# Patient Record
Sex: Female | Born: 1975 | Race: Black or African American | Hispanic: No | Marital: Single | Smoking: Former smoker
Health system: Southern US, Community
[De-identification: ages and names within clinical notes are randomized; demographics above are authoritative.]

## PROBLEM LIST (undated history)

## (undated) ENCOUNTER — Inpatient Hospital Stay (HOSPITAL_COMMUNITY): Payer: Self-pay

## (undated) DIAGNOSIS — I10 Essential (primary) hypertension: Secondary | ICD-10-CM

## (undated) DIAGNOSIS — E669 Obesity, unspecified: Secondary | ICD-10-CM

## (undated) DIAGNOSIS — E785 Hyperlipidemia, unspecified: Secondary | ICD-10-CM

## (undated) DIAGNOSIS — E66811 Obesity, class 1: Secondary | ICD-10-CM

## (undated) HISTORY — DX: Hyperlipidemia, unspecified: E78.5

## (undated) HISTORY — DX: Obesity, unspecified: E66.9

## (undated) HISTORY — PX: TONSILLECTOMY: SUR1361

## (undated) HISTORY — DX: Obesity, class 1: E66.811

---

## 2003-07-25 ENCOUNTER — Emergency Department (HOSPITAL_COMMUNITY): Admission: EM | Admit: 2003-07-25 | Discharge: 2003-07-25 | Payer: Self-pay | Admitting: Emergency Medicine

## 2009-09-19 ENCOUNTER — Encounter: Admission: RE | Admit: 2009-09-19 | Discharge: 2009-09-19 | Payer: Self-pay | Admitting: Family Medicine

## 2009-10-28 ENCOUNTER — Ambulatory Visit (HOSPITAL_BASED_OUTPATIENT_CLINIC_OR_DEPARTMENT_OTHER): Admission: RE | Admit: 2009-10-28 | Discharge: 2009-10-28 | Payer: Self-pay | Admitting: Otolaryngology

## 2010-08-26 ENCOUNTER — Emergency Department (HOSPITAL_COMMUNITY)
Admission: EM | Admit: 2010-08-26 | Discharge: 2010-08-26 | Disposition: A | Discharge status: Home / Self Care | Payer: Medicaid Other | Attending: Emergency Medicine | Admitting: Emergency Medicine

## 2010-08-26 ENCOUNTER — Emergency Department (HOSPITAL_COMMUNITY): Payer: Medicaid Other

## 2010-08-26 DIAGNOSIS — R112 Nausea with vomiting, unspecified: Secondary | ICD-10-CM | POA: Insufficient documentation

## 2010-08-26 DIAGNOSIS — R10816 Epigastric abdominal tenderness: Secondary | ICD-10-CM | POA: Insufficient documentation

## 2010-08-26 DIAGNOSIS — R109 Unspecified abdominal pain: Secondary | ICD-10-CM | POA: Insufficient documentation

## 2010-08-26 LAB — URINALYSIS, ROUTINE W REFLEX MICROSCOPIC
Bilirubin Urine: NEGATIVE
Hgb urine dipstick: NEGATIVE
Protein, ur: NEGATIVE mg/dL
Urobilinogen, UA: 1 mg/dL (ref 0.0–1.0)

## 2010-08-26 LAB — DIFFERENTIAL
Basophils Absolute: 0 10*3/uL (ref 0.0–0.1)
Eosinophils Absolute: 0 10*3/uL (ref 0.0–0.7)
Eosinophils Relative: 0 % (ref 0–5)
Lymphocytes Relative: 15 % (ref 12–46)
Monocytes Absolute: 0.9 10*3/uL (ref 0.1–1.0)

## 2010-08-26 LAB — COMPREHENSIVE METABOLIC PANEL
BUN: 9 mg/dL (ref 6–23)
Calcium: 9.3 mg/dL (ref 8.4–10.5)
GFR calc Af Amer: >60 mL/min (ref >60)
Glucose, Bld: 90 mg/dL (ref 70–99)
Sodium: 136 mEq/L (ref 135–145)
Total Protein: 7.5 g/dL (ref 6.0–8.3)

## 2010-08-26 LAB — CBC
HCT: 41 % (ref 36.0–46.0)
MCHC: 34.1 g/dL (ref 30.0–36.0)
RDW: 13.9 % (ref 11.5–15.5)

## 2010-08-26 LAB — LIPASE, BLOOD: Lipase: 17 U/L (ref 11–59)

## 2011-07-13 ENCOUNTER — Other Ambulatory Visit: Payer: Self-pay | Admitting: Family Medicine

## 2011-07-13 DIAGNOSIS — N63 Unspecified lump in unspecified breast: Secondary | ICD-10-CM

## 2011-07-16 ENCOUNTER — Ambulatory Visit
Admission: RE | Admit: 2011-07-16 | Discharge: 2011-07-16 | Disposition: A | Discharge status: Home / Self Care | Payer: Medicaid Other | Source: Ambulatory Visit | Attending: Family Medicine | Admitting: Family Medicine

## 2011-07-16 DIAGNOSIS — N63 Unspecified lump in unspecified breast: Secondary | ICD-10-CM

## 2011-10-13 ENCOUNTER — Encounter (HOSPITAL_COMMUNITY): Payer: Self-pay | Admitting: *Deleted

## 2011-10-13 ENCOUNTER — Emergency Department (HOSPITAL_COMMUNITY)
Admission: EM | Admit: 2011-10-13 | Discharge: 2011-10-14 | Disposition: A | Discharge status: Home / Self Care | Payer: Medicaid Other | Attending: Emergency Medicine | Admitting: Emergency Medicine

## 2011-10-13 DIAGNOSIS — F17200 Nicotine dependence, unspecified, uncomplicated: Secondary | ICD-10-CM | POA: Insufficient documentation

## 2011-10-13 DIAGNOSIS — L42 Pityriasis rosea: Secondary | ICD-10-CM

## 2011-10-13 DIAGNOSIS — I10 Essential (primary) hypertension: Secondary | ICD-10-CM

## 2011-10-13 NOTE — ED Notes (Signed)
Pt noticed a macular rash on her R arm, bottom and abd that are mildly pruritic.

## 2011-10-14 MED ORDER — FAMOTIDINE 20 MG PO TABS
40.0000 mg | ORAL_TABLET | Freq: Once | ORAL | Status: AC
  Administered 2011-10-14: 40 mg via ORAL
  Filled 2011-10-14: qty 2

## 2011-10-14 MED ORDER — FAMOTIDINE 40 MG PO TABS: 20.0000 mg | ORAL_TABLET | Freq: Two times a day (BID) | ORAL | Status: DC

## 2011-10-14 NOTE — ED Provider Notes (Signed)
History     CSN: [id number]  Arrival date & time 10/13/11  2134   First MD Initiated Contact with Patient 10/14/11 0114      Chief Complaint  Patient presents with  . Rash    (Consider location/radiation/quality/duration/timing/severity/associated sxs/prior treatment) HPI Comments: Rash started on inner R thigh 3 days ago now with other smaller lesions on upper abdomen, back upper arms   The history is provided by the patient.    History reviewed. No pertinent past medical history.  Past Surgical History  Procedure Date  . Tonsillectomy     No family history on file.  History  Substance Use Topics  . Smoking status: Current Everyday Smoker -- 5 years    Types: Cigarettes  . Smokeless tobacco: Not on file  . Alcohol Use: No    OB History    Grav Para Term Preterm Abortions TAB SAB Ect Mult Living                  Review of Systems  Constitutional: Negative for fever.  Skin: Positive for rash.  Neurological: Negative for weakness.    Allergies  Review of patient's allergies indicates no known allergies.  Home Medications   Current Outpatient Rx  Name Route Sig Dispense Refill  . BEE POLLEN PO Oral Take 1 capsule by mouth daily as needed. For allergies    . FAMOTIDINE 40 MG PO TABS Oral Take 0.5 tablets (20 mg total) by mouth 2 (two) times daily. 60 tablet 0    BP 183/110  Pulse 100  Temp 99.2 F (37.3 C) (Oral)  Resp 18  SpO2 100%  Physical Exam  Constitutional: She appears well-developed.  Eyes: Pupils are equal, round, and reactive to light.  Cardiovascular: Normal rate.   Pulmonary/Chest: Effort normal.  Musculoskeletal: Normal range of motion.  Neurological: She is alert.  Skin: Rash noted.    ED Course  Procedures (including critical care time)  Labs Reviewed - No data to display No results found.   1. Pityriasis rosea   2. Hypertension       MDM          Arman Filter, NP 10/14/11 2021  Arman Filter,  NP 10/14/11 2021

## 2011-10-15 NOTE — ED Provider Notes (Signed)
Medical screening examination/treatment/procedure(s) were performed by non-physician practitioner and as supervising physician I was immediately available for consultation/collaboration.  Flint Melter, MD 10/15/11 [phone]

## 2011-12-06 ENCOUNTER — Emergency Department (HOSPITAL_COMMUNITY): Admission: EM | Admit: 2011-12-06 | Discharge: 2011-12-06 | Payer: Self-pay | Source: Home / Self Care

## 2011-12-29 ENCOUNTER — Other Ambulatory Visit: Payer: Self-pay

## 2011-12-29 ENCOUNTER — Other Ambulatory Visit (HOSPITAL_COMMUNITY): Payer: Self-pay | Admitting: Family Medicine

## 2011-12-29 ENCOUNTER — Ambulatory Visit (HOSPITAL_COMMUNITY)
Admission: RE | Admit: 2011-12-29 | Discharge: 2011-12-29 | Disposition: A | Discharge status: Home / Self Care | Payer: Medicaid Other | Source: Ambulatory Visit | Attending: Family Medicine | Admitting: Family Medicine

## 2011-12-29 DIAGNOSIS — I10 Essential (primary) hypertension: Secondary | ICD-10-CM | POA: Insufficient documentation

## 2011-12-29 DIAGNOSIS — R079 Chest pain, unspecified: Secondary | ICD-10-CM | POA: Insufficient documentation

## 2011-12-29 DIAGNOSIS — R52 Pain, unspecified: Secondary | ICD-10-CM

## 2011-12-29 DIAGNOSIS — Z01812 Encounter for preprocedural laboratory examination: Secondary | ICD-10-CM | POA: Insufficient documentation

## 2011-12-29 LAB — CBC
Hemoglobin: 13.4 g/dL (ref 12.0–15.0)
RBC: 4.78 MIL/uL (ref 3.87–5.11)
WBC: 6.7 10*3/uL (ref 4.0–10.5)

## 2011-12-29 LAB — MICROALBUMIN, URINE: Microalb, Ur: 5.16 mg/dL — ABNORMAL HIGH (ref 0.00–1.89)

## 2011-12-29 LAB — COMPREHENSIVE METABOLIC PANEL
ALT: 15 U/L (ref 0–35)
Alkaline Phosphatase: 79 U/L (ref 39–117)
CO2: 28 mEq/L (ref 19–32)
GFR calc Af Amer: >90 mL/min (ref >90)
GFR calc non Af Amer: 88 mL/min — ABNORMAL LOW (ref >90)
Glucose, Bld: 107 mg/dL — ABNORMAL HIGH (ref 70–99)
Potassium: 4.3 mEq/L (ref 3.5–5.1)
Sodium: 139 mEq/L (ref 135–145)
Total Protein: 7.6 g/dL (ref 6.0–8.3)

## 2011-12-29 LAB — LIPID PANEL: Total CHOL/HDL Ratio: 5.3 RATIO

## 2011-12-29 LAB — TSH: TSH: 0.553 u[IU]/mL (ref 0.350–4.500)

## 2013-09-10 ENCOUNTER — Other Ambulatory Visit (HOSPITAL_COMMUNITY): Payer: Self-pay | Admitting: Internal Medicine

## 2013-09-10 ENCOUNTER — Ambulatory Visit (HOSPITAL_COMMUNITY)
Admission: RE | Admit: 2013-09-10 | Discharge: 2013-09-10 | Disposition: A | Discharge status: Home / Self Care | Payer: Medicaid Other | Source: Ambulatory Visit | Attending: Internal Medicine | Admitting: Internal Medicine

## 2013-09-10 DIAGNOSIS — M79641 Pain in right hand: Secondary | ICD-10-CM

## 2013-09-10 DIAGNOSIS — W19XXXA Unspecified fall, initial encounter: Secondary | ICD-10-CM

## 2013-09-10 DIAGNOSIS — M79609 Pain in unspecified limb: Secondary | ICD-10-CM | POA: Insufficient documentation

## 2013-09-10 DIAGNOSIS — Y92009 Unspecified place in unspecified non-institutional (private) residence as the place of occurrence of the external cause: Secondary | ICD-10-CM

## 2013-11-16 ENCOUNTER — Encounter (HOSPITAL_COMMUNITY): Payer: Self-pay | Admitting: *Deleted

## 2013-11-16 ENCOUNTER — Inpatient Hospital Stay (HOSPITAL_COMMUNITY): Payer: Medicaid Other

## 2013-11-16 ENCOUNTER — Inpatient Hospital Stay (HOSPITAL_COMMUNITY)
Admission: AD | Admit: 2013-11-16 | Discharge: 2013-11-16 | Disposition: A | Discharge status: Home / Self Care | Payer: Medicaid Other | Source: Ambulatory Visit | Attending: Obstetrics and Gynecology | Admitting: Obstetrics and Gynecology

## 2013-11-16 DIAGNOSIS — M545 Low back pain, unspecified: Secondary | ICD-10-CM | POA: Insufficient documentation

## 2013-11-16 DIAGNOSIS — O99330 Smoking (tobacco) complicating pregnancy, unspecified trimester: Secondary | ICD-10-CM | POA: Insufficient documentation

## 2013-11-16 DIAGNOSIS — O209 Hemorrhage in early pregnancy, unspecified: Secondary | ICD-10-CM | POA: Diagnosis not present

## 2013-11-16 DIAGNOSIS — O09529 Supervision of elderly multigravida, unspecified trimester: Secondary | ICD-10-CM | POA: Insufficient documentation

## 2013-11-16 HISTORY — DX: Essential (primary) hypertension: I10

## 2013-11-16 LAB — URINALYSIS, ROUTINE W REFLEX MICROSCOPIC
Bilirubin Urine: NEGATIVE
GLUCOSE, UA: NEGATIVE mg/dL
KETONES UR: NEGATIVE mg/dL
LEUKOCYTES UA: NEGATIVE
Nitrite: NEGATIVE
PH: 7 (ref 5.0–8.0)
Protein, ur: NEGATIVE mg/dL
Specific Gravity, Urine: 1.01 (ref 1.005–1.030)
Urobilinogen, UA: 1 mg/dL (ref 0.0–1.0)

## 2013-11-16 LAB — CBC
HEMATOCRIT: 38.4 % (ref 36.0–46.0)
HEMOGLOBIN: 12.9 g/dL (ref 12.0–15.0)
MCH: 28.9 pg (ref 26.0–34.0)
MCHC: 33.6 g/dL (ref 30.0–36.0)
MCV: 85.9 fL (ref 78.0–100.0)
Platelets: 378 10*3/uL (ref 150–400)
RBC: 4.47 MIL/uL (ref 3.87–5.11)
RDW: 15.5 % (ref 11.5–15.5)
WBC: 7.4 10*3/uL (ref 4.0–10.5)

## 2013-11-16 LAB — HIV ANTIBODY (ROUTINE TESTING W REFLEX): HIV: NONREACTIVE

## 2013-11-16 LAB — HCG, QUANTITATIVE, PREGNANCY: HCG, BETA CHAIN, QUANT, S: 17358 m[IU]/mL — AB (ref <5)

## 2013-11-16 LAB — URINE MICROSCOPIC-ADD ON

## 2013-11-16 LAB — WET PREP, GENITAL
TRICH WET PREP: NONE SEEN
YEAST WET PREP: NONE SEEN

## 2013-11-16 LAB — POCT PREGNANCY, URINE: PREG TEST UR: POSITIVE — AB

## 2013-11-16 LAB — ABO/RH: ABO/RH(D): B POS

## 2013-11-16 NOTE — Discharge Instructions (Signed)
Vaginal Bleeding During Pregnancy, First Trimester  A small amount of bleeding (spotting) from the vagina is relatively common in early pregnancy. It usually stops on its own. Various things may cause bleeding or spotting in early pregnancy. Some bleeding may be related to the pregnancy, and some may not. In most cases, the bleeding is normal and is not a problem. However, bleeding can also be a sign of something serious. Be sure to tell your health care provider about any vaginal bleeding right away.  Some possible causes of vaginal bleeding during the first trimester include:  · Infection or inflammation of the cervix.  · Growths (polyps) on the cervix.  · Miscarriage or threatened miscarriage.  · Pregnancy tissue has developed outside of the uterus and in a fallopian tube (tubal pregnancy).  · Tiny cysts have developed in the uterus instead of pregnancy tissue (molar pregnancy).  HOME CARE INSTRUCTIONS   Watch your condition for any changes. The following actions may help to lessen any discomfort you are feeling:  · Follow your health care provider's instructions for limiting your activity. If your health care provider orders bed rest, you may need to stay in bed and only get up to use the bathroom. However, your health care provider may allow you to continue light activity.  · If needed, make plans for someone to help with your regular activities and responsibilities while you are on bed rest.  · Keep track of the number of pads you use each day, how often you change pads, and how soaked (saturated) they are. Write this down.  · Do not use tampons. Do not douche.  · Do not have sexual intercourse or orgasms until approved by your health care provider.  · If you pass any tissue from your vagina, save the tissue so you can show it to your health care provider.  · Only take over-the-counter or prescription medicines as directed by your health care provider.  · Do not take aspirin because it can make you  bleed.  · Keep all follow-up appointments as directed by your health care provider.  SEEK MEDICAL CARE IF:  · You have any vaginal bleeding during any part of your pregnancy.  · You have cramps or labor pains.  · You have a fever, not controlled by medicine.  SEEK IMMEDIATE MEDICAL CARE IF:   · You have severe cramps in your back or belly (abdomen).  · You pass large clots or tissue from your vagina.  · Your bleeding increases.  · You feel light-headed or weak, or you have fainting episodes.  · You have chills.  · You are leaking fluid or have a gush of fluid from your vagina.  · You pass out while having a bowel movement.  MAKE SURE YOU:  · Understand these instructions.  · Will watch your condition.  · Will get help right away if you are not doing well or get worse.  Document Released: 12/02/2004 Document Revised: 02/27/2013 Document Reviewed: 10/30/2012  ExitCare® Patient Information ©2015 [employer]. This information is not intended to replace advice given to you by your health care provider. Make sure you discuss any questions you have with your health care provider.

## 2013-11-16 NOTE — MAU Note (Signed)
Pt presents to MAU with complaints of lower back pain for approximately 2 weeks. Pt reports taking a "Morning after pill" in July after having intercourse. Reports she has had a light period in August.

## 2013-11-16 NOTE — MAU Provider Note (Signed)
Chief Complaint: Back Pain   First Provider Initiated Contact with Patient 11/16/13 0914     SUBJECTIVE HPI: Jennifer Ayers is a 38 y.o. G3P1011 at [redacted]w[redacted]d by LNMP 09/17/13 who presents with LBP x 2 days and vaginal spotting episodes once in August and again 3 days ago. Denies vaginal irritation or age. Denies dysuria, urgency or frequency or hematuria. She has had some nausea without vomiting. She has not taken her Norvasc for 2 days due to concern with usage in pregnancy. Desired pregnancy though she took Morning-after pill in early July.  Past Medical History  Diagnosis Date  . Hypertension    OB History  Gravida Para Term Preterm AB SAB TAB Ectopic Multiple Living  0 1 1 0 0 0 1    # Outcome Date GA Lbr Len/2nd Weight Sex Delivery Anes PTL Lv  3 TRM 06/03/01    M LTCS EPI N Y  2 SAB           1 GRA             C/S in [place] for NRFHR in labor  Past Surgical History  Procedure Laterality Date  . Tonsillectomy    . Cesarean section     History   Social History  . Marital Status: Married    Spouse Name: N/A    Number of Children: N/A  . Years of Education: N/A   Occupational History  . Not on file.   Social History Main Topics  . Smoking status: Current Every Day Smoker -- 5 years    Types: Cigarettes  . Smokeless tobacco: Not on file  . Alcohol Use: No  . Drug Use: No  . Sexual Activity: Yes    Birth Control/ Protection: None   Other Topics Concern  . Not on file   Social History Narrative  . No narrative on file   No current facility-administered medications on file prior to encounter.   No current outpatient prescriptions on file prior to encounter.   No Known Allergies  ROS: Pertinent items in HPI  OBJECTIVE Blood pressure 149/90, pulse 77, temperature 98.6 F (37 C), resp. rate 18, last menstrual period 09/17/2013. GENERAL: Well-developed, well-nourished female in no acute distress.  HEENT: Normocephalic HEART: normal rate RESP: normal  effort ABDOMEN: Soft, non-tender EXTREMITIES: Nontender, no edema NEURO: Alert and oriented SPECULUM EXAM: NEFG, physiologic discharge, no blood noted, cervix clean BIMANUAL: cervix C/L; uterus normal size, no adnexal tenderness or masses  LAB RESULTS Results for orders placed during the hospital encounter of 11/16/13 (from the past 24 hour(s))  URINALYSIS, ROUTINE W REFLEX MICROSCOPIC     Status: Abnormal   Collection Time    11/16/13  8:30 AM      Result Value Ref Range   Color, Urine YELLOW  YELLOW   APPearance CLEAR  CLEAR   Specific Gravity, Urine 1.010  1.005 - 1.030   pH 7.0  5.0 - 8.0   Glucose, UA NEGATIVE  NEGATIVE mg/dL   Hgb urine dipstick TRACE (*) NEGATIVE   Bilirubin Urine NEGATIVE  NEGATIVE   Ketones, ur NEGATIVE  NEGATIVE mg/dL   Protein, ur NEGATIVE  NEGATIVE mg/dL   Urobilinogen, UA 1.0  0.0 - 1.0 mg/dL   Nitrite NEGATIVE  NEGATIVE   Leukocytes, UA NEGATIVE  NEGATIVE  URINE MICROSCOPIC-ADD ON     Status: Abnormal   Collection Time    11/16/13  8:30 AM      Result Value Ref Range  Squamous Epithelial / LPF FEW (*) RARE   WBC, UA 0-2  <3 WBC/hpf   Bacteria, UA FEW (*) RARE   Urine-Other MUCOUS PRESENT    POCT PREGNANCY, URINE     Status: Abnormal   Collection Time    11/16/13  8:58 AM      Result Value Ref Range   Preg Test, Ur POSITIVE (*) NEGATIVE  WET PREP, GENITAL     Status: Abnormal   Collection Time    11/16/13  9:30 AM      Result Value Ref Range   Yeast Wet Prep HPF POC NONE SEEN  NONE SEEN   Trich, Wet Prep NONE SEEN  NONE SEEN   Clue Cells Wet Prep HPF POC FEW (*) NONE SEEN   WBC, Wet Prep HPF POC FEW (*) NONE SEEN  ABO/RH     Status: None   Collection Time    11/16/13  9:50 AM      Result Value Ref Range   ABO/RH(D) B POS    CBC     Status: None   Collection Time    11/16/13  9:50 AM      Result Value Ref Range   WBC 7.4  4.0 - 10.5 K/uL   RBC 4.47  3.87 - 5.11 MIL/uL   Hemoglobin 12.9  12.0 - 15.0 g/dL   HCT 16.1  09.6 - 04.5 %    MCV 85.9  78.0 - 100.0 fL   MCH 28.9  26.0 - 34.0 pg   MCHC 33.6  30.0 - 36.0 g/dL   RDW 40.9  81.1 - 91.4 %   Platelets 378  150 - 400 K/uL    IMAGING  CLINICAL DATA: Vaginal spotting, pain for 2 weeks, took morning  after fill in July 2015  EXAM:  OBSTETRIC <14 WK [place] AND TRANSVAGINAL OB US  TECHNIQUE:  Both transabdominal and transvaginal ultrasound examinations were  performed for complete evaluation of the gestation as well as the  maternal uterus, adnexal regions, and pelvic cul-de-sac.  Transvaginal technique was performed to assess early pregnancy.  COMPARISON: None  FINDINGS:  Intrauterine gestational sac: Visualized, slightly irregularly  shaped  Yolk sac: Not visualized  Embryo: Not visualized  Cardiac Activity: N/A  Heart Rate: N/A bpm  MSD: 13 mm 6 w 1 d [place] EDC: 07/11/2014  Maternal uterus/adnexae:  Very small subchorionic hemorrhage.  No uterine mass.  LEFT ovary normal size and morphology, 2.2 x 2.8 x 2.6 cm.  RIGHT ovary normal size and morphology, 2.1 x 1.7 x 2.7 cm.  No free pelvic fluid or adnexal masses.  IMPRESSION:  Probable early intrauterine gestational sac, but no yolk sac, fetal  pole, or cardiac activity yet visualized. Recommend follow-up  quantitative B-HCG levels and follow-up US in 14 days to confirm and  assess viability. This recommendation follows SRU consensus  guidelines: Diagnostic Criteria for Nonviable Pregnancy Early in the  First Trimester. Malva Limes Med 2013; 782:9562-13.  Very small subchorionic hemorrhage.  Electronically Signed  By: Ulyses Southward M.D.  On: 11/16/2013 10:48         MAU COURSE Discussed with pt possible BV, asymptomatic> wishes to defer tx or recheck [facility] after 1st trimester Pt left before quantitative bHCG result back ( I will call her with results) Explained possibility of early pregnancy failure with empty sac and MSD c/w [redacted]w[redacted]d.  ASSESSMENT 1. Bleeding in early pregnancy   Pregnancy location and viability  unknown  PLAN Discharge home with ectopic  and bleeding precautions    Medication List    STOP taking these medications       amLODipine 10 MG tablet  Commonly known as:  NORVASC     aspirin 81 MG chewable tablet     lovastatin 10 MG tablet  Commonly known as:  MEVACOR      Call PMD re anti- HTN med adjustment after viable IUP confirmation  Follow-up Information   Follow up with Nursepractioner Mau, NP In 2 days. (repeat blood test)       Danae Orleans, CNM 11/16/2013  9:29 AM   Addendum:  Quantitative beta hCG was 17,358. Pt notified by phone message as planned. F/U tomorrow.  Danae Orleans, CNM 11/17/2013 12:45 PM

## 2013-11-17 LAB — GC/CHLAMYDIA PROBE AMP
CT Probe RNA: NEGATIVE
GC PROBE AMP APTIMA: NEGATIVE

## 2013-11-20 NOTE — MAU Provider Note (Signed)
Attestation of Attending Supervision of Advanced Practitioner (CNM/NP): Evaluation and management procedures were performed by the Advanced Practitioner under my supervision and collaboration.  I have reviewed the Advanced Practitioner's note and chart, and I agree with the management and plan.  Dorothyann Mourer 11/20/2013 9:33 AM   

## 2013-11-25 ENCOUNTER — Inpatient Hospital Stay (HOSPITAL_COMMUNITY)
Admission: AD | Admit: 2013-11-25 | Discharge: 2013-11-25 | Disposition: A | Discharge status: Home / Self Care | Payer: Medicaid Other | Source: Ambulatory Visit | Attending: Obstetrics | Admitting: Obstetrics

## 2013-11-25 DIAGNOSIS — O039 Complete or unspecified spontaneous abortion without complication: Secondary | ICD-10-CM | POA: Insufficient documentation

## 2013-11-25 LAB — HCG, QUANTITATIVE, PREGNANCY: HCG, BETA CHAIN, QUANT, S: 9695 m[IU]/mL — AB (ref <5)

## 2013-11-25 NOTE — Discharge Instructions (Signed)
Miscarriage °A miscarriage is the loss of an unborn baby (fetus) before the 20th week of pregnancy. The cause is often unknown.  °HOME CARE °· You may need to stay in bed (bed rest), or you may be able to do light activity. Go about activity as told by your doctor. °· Have help at home. °· Write down how many pads you use each day. Write down how soaked they are. °· Do not use tampons. Do not wash out your vagina (douche) or have sex (intercourse) until your doctor approves. °· Only take medicine as told by your doctor. °· Do not take aspirin. °· Keep all doctor visits as told. °· If you or your partner have problems with grieving, talk to your doctor. You can also try counseling. Give yourself time to grieve before trying to get pregnant again. °GET HELP RIGHT AWAY IF: °· You have bad cramps or pain in your back or belly (abdomen). °· You have a fever. °· You pass large clumps of blood (clots) from your vagina that are walnut-sized or larger. Save the clumps for your doctor to see. °· You pass large amounts of tissue from your vagina. Save the tissue for your doctor to see. °· You have more bleeding. °· You have thick, bad-smelling fluid (discharge) coming from the vagina. °· You get lightheaded, weak, or you pass out (faint). °· You have chills. °MAKE SURE YOU: °· Understand these instructions. °· Will watch your condition. °· Will get help right away if you are not doing well or get worse. °Document Released: 05/17/2011 Document Reviewed: 05/17/2011 °ExitCare® Patient Information ©2015 [employer]. This information is not intended to replace advice given to you by your health care provider. Make sure you discuss any questions you have with your health care provider. ° °

## 2013-11-25 NOTE — MAU Note (Signed)
Pt presents to MAU for repeat BHCG. Denies any pain or vaginal bleeding 

## 2013-11-25 NOTE — MAU Provider Note (Signed)
Jennifer Ayers is a 38 y.o. G4P1011 at [redacted]w[redacted]d here for follow up quant HCG. Vaginal bleeding: none. Abdominal pain: none. She previously passed "a few large clots", but has had no bleeding since.   Prior HCGs: Results for Jennifer, [facility] (MRN [id number]) as of 11/25/2013 09:51  Ref. Range 11/16/2013 09:50  hCG, Beta Chain, Quant, S Latest Range: <5 mIU/mL 17358 (H)   Prior U/S: US Ob Comp Less 14 Wks  11/16/2013   CLINICAL DATA:  Vaginal spotting, pain for 2 weeks, took morning after fill in July 2015  EXAM: OBSTETRIC <14 WK [place] AND TRANSVAGINAL OB US  TECHNIQUE: Both transabdominal and transvaginal ultrasound examinations were performed for complete evaluation of the gestation as well as the maternal uterus, adnexal regions, and pelvic cul-de-sac. Transvaginal technique was performed to assess early pregnancy.  COMPARISON:  None  FINDINGS: Intrauterine gestational sac: Visualized, slightly irregularly shaped  Yolk sac:  Not visualized  Embryo:  Not visualized  Cardiac Activity: N/A  Heart Rate:  N/A bpm  MSD:  13  mm   6 w   1  d               [place] EDC: 07/11/2014  Maternal uterus/adnexae:  Very small subchorionic hemorrhage.  No uterine mass.  LEFT ovary normal size and morphology, 2.2 x 2.8 x 2.6 cm.  RIGHT ovary normal size and morphology, 2.1 x 1.7 x 2.7 cm.  No free pelvic fluid or adnexal masses.  IMPRESSION: Probable early intrauterine gestational sac, but no yolk sac, fetal pole, or cardiac activity yet visualized. Recommend follow-up quantitative B-HCG levels and follow-up US in 14 days to confirm and assess viability. This recommendation follows SRU consensus guidelines: Diagnostic Criteria for Nonviable Pregnancy Early in the First Trimester. Malva Limes Med 2013; 956:3875-64.  Very small subchorionic hemorrhage.   Electronically Signed   By: Ulyses Southward M.D.   On: 11/16/2013 10:48   US Ob Transvaginal  11/16/2013   CLINICAL DATA:  Vaginal spotting, pain for 2 weeks, took morning after fill in July  2015  EXAM: OBSTETRIC <14 WK [place] AND TRANSVAGINAL OB US  TECHNIQUE: Both transabdominal and transvaginal ultrasound examinations were performed for complete evaluation of the gestation as well as the maternal uterus, adnexal regions, and pelvic cul-de-sac. Transvaginal technique was performed to assess early pregnancy.  COMPARISON:  None  FINDINGS: Intrauterine gestational sac: Visualized, slightly irregularly shaped  Yolk sac:  Not visualized  Embryo:  Not visualized  Cardiac Activity: N/A  Heart Rate:  N/A bpm  MSD:  13  mm   6 w   1  d               [place] EDC: 07/11/2014  Maternal uterus/adnexae:  Very small subchorionic hemorrhage.  No uterine mass.  LEFT ovary normal size and morphology, 2.2 x 2.8 x 2.6 cm.  RIGHT ovary normal size and morphology, 2.1 x 1.7 x 2.7 cm.  No free pelvic fluid or adnexal masses.  IMPRESSION: Probable early intrauterine gestational sac, but no yolk sac, fetal pole, or cardiac activity yet visualized. Recommend follow-up quantitative B-HCG levels and follow-up US in 14 days to confirm and assess viability. This recommendation follows SRU consensus guidelines: Diagnostic Criteria for Nonviable Pregnancy Early in the First Trimester. Malva Limes Med 2013; 332:9518-84.  Very small subchorionic hemorrhage.   Electronically Signed   By: Ulyses Southward M.D.   On: 11/16/2013 10:48    Past Medical History  Diagnosis Date  .  Hypertension     No prescriptions prior to admission    No Known Allergies  Review of Systems: negative  Objective BP 133/83  Pulse 73  Temp(Src) 98.7 F (37.1 C)  Resp 18  LMP 09/17/2013  General: Alert, oriented, no acute distress  Results for orders placed during the hospital encounter of 11/25/13 (from the past 24 hour(s))  HCG, QUANTITATIVE, PREGNANCY     Status: Abnormal   Collection Time    11/25/13  9:05 AM      Result Value Ref Range   hCG, Beta Chain, Quant, S 9695 (*) <5 mIU/mL    Assessment  1. SAB (spontaneous abortion)   Significant  decrease in BHCG c/w SAB, precautions rev'd, return to MAU w/ pain or bleeding, f/u in clinic in 1 week for repeat BHCG     Medication List    Notice   You have not been prescribed any medications.      Follow-up Information   Follow up with [facility] In 1 week. (for repeat labs, between 8 AM and 11 AM)    Specialty:  Obstetrics and Gynecology   Contact information:   [address] [place] [address] [phone]      [name] 10:30 AM 11/25/2013

## 2013-11-25 NOTE — MAU Provider Note (Signed)
Attestation of Attending Supervision of Advanced Practitioner (CNM/NP): Evaluation and management procedures were performed by the Advanced Practitioner under my supervision and collaboration.  I have reviewed the Advanced Practitioner's note and chart, and I agree with the management and plan.  Evelean Bigler 11/25/2013 11:33 AM

## 2013-12-01 ENCOUNTER — Inpatient Hospital Stay (HOSPITAL_COMMUNITY)
Admission: AD | Admit: 2013-12-01 | Discharge: 2013-12-01 | Disposition: A | Discharge status: Home / Self Care | Payer: Medicaid Other | Source: Ambulatory Visit | Attending: Obstetrics & Gynecology | Admitting: Obstetrics & Gynecology

## 2013-12-01 ENCOUNTER — Encounter (HOSPITAL_COMMUNITY): Payer: Self-pay | Admitting: *Deleted

## 2013-12-01 ENCOUNTER — Inpatient Hospital Stay (HOSPITAL_COMMUNITY): Payer: Medicaid Other

## 2013-12-01 DIAGNOSIS — I10 Essential (primary) hypertension: Secondary | ICD-10-CM | POA: Insufficient documentation

## 2013-12-01 DIAGNOSIS — F17200 Nicotine dependence, unspecified, uncomplicated: Secondary | ICD-10-CM | POA: Insufficient documentation

## 2013-12-01 DIAGNOSIS — O209 Hemorrhage in early pregnancy, unspecified: Secondary | ICD-10-CM | POA: Diagnosis present

## 2013-12-01 DIAGNOSIS — O021 Missed abortion: Secondary | ICD-10-CM | POA: Diagnosis not present

## 2013-12-01 LAB — CBC
HCT: 39.8 % (ref 36.0–46.0)
Hemoglobin: 13.4 g/dL (ref 12.0–15.0)
MCH: 28.8 pg (ref 26.0–34.0)
MCHC: 33.7 g/dL (ref 30.0–36.0)
MCV: 85.6 fL (ref 78.0–100.0)
PLATELETS: 365 10*3/uL (ref 150–400)
RBC: 4.65 MIL/uL (ref 3.87–5.11)
RDW: 15.4 % (ref 11.5–15.5)
WBC: 7.9 10*3/uL (ref 4.0–10.5)

## 2013-12-01 LAB — URINALYSIS, ROUTINE W REFLEX MICROSCOPIC
BILIRUBIN URINE: NEGATIVE
Glucose, UA: NEGATIVE mg/dL
Ketones, ur: NEGATIVE mg/dL
Nitrite: NEGATIVE
Protein, ur: 100 mg/dL — AB
Specific Gravity, Urine: 1.02 (ref 1.005–1.030)
UROBILINOGEN UA: 1 mg/dL (ref 0.0–1.0)
pH: 6 (ref 5.0–8.0)

## 2013-12-01 LAB — URINE MICROSCOPIC-ADD ON

## 2013-12-01 LAB — HCG, QUANTITATIVE, PREGNANCY: hCG, Beta Chain, Quant, S: 4979 m[IU]/mL — ABNORMAL HIGH (ref <5)

## 2013-12-01 MED ORDER — MISOPROSTOL 200 MCG PO TABS: 800.0000 ug | ORAL_TABLET | Freq: Once | ORAL | Status: DC

## 2013-12-01 MED ORDER — PROMETHAZINE HCL 25 MG PO TABS: 25.0000 mg | ORAL_TABLET | Freq: Four times a day (QID) | ORAL | Status: DC | PRN

## 2013-12-01 MED ORDER — IBUPROFEN 600 MG PO TABS: 600.0000 mg | ORAL_TABLET | Freq: Four times a day (QID) | ORAL | Status: DC | PRN

## 2013-12-01 MED ORDER — HYDROCODONE-ACETAMINOPHEN 5-325 MG PO TABS: 1.0000 | ORAL_TABLET | ORAL | Status: DC | PRN

## 2013-12-01 NOTE — MAU Note (Signed)
Pt states here for bleeding and cramping. Has appt downstairs for re-eval of BHCG levels dropping.

## 2013-12-01 NOTE — MAU Provider Note (Signed)
Attestation of Attending Supervision of Advanced Practitioner (CNM/NP): Evaluation and management procedures were performed by the Advanced Practitioner under my supervision and collaboration.  I have reviewed the Advanced Practitioner's note and chart, and I agree with the management and plan.  HARRAWAY-SMITH, Malachy Coleman 6:39 PM

## 2013-12-01 NOTE — MAU Provider Note (Signed)
History     CSN: [id number]  Arrival date and time: 12/01/13 4098   First Provider Initiated Contact with Patient 12/01/13 1039      Chief Complaint  Patient presents with  . Vaginal Bleeding   HPI  [name] is a 38 y.o. female [id number] at [redacted]w[redacted]d who presents with vaginal bleeding. The patient was seen a week ago for HCG levels. She ordinally came in for back pain and bleeding. Her beta hcg levels have dropped significantly and was told that this is likely a SAB in progress. She was instructed to go to the clinic on Monday morning for another hcg level. She started bleeding this morning and came for evaluation. She is having menstrual like cramping that she rates a 5/10.    OB History   Grav Para Term Preterm Abortions TAB SAB Ect Mult Living   0 1 0 1 0 0 1      Past Medical History  Diagnosis Date  . Hypertension     Past Surgical History  Procedure Laterality Date  . Tonsillectomy    . Cesarean section      History reviewed. No pertinent family history.  History  Substance Use Topics  . Smoking status: Current Every Day Smoker -- 5 years    Types: Cigarettes  . Smokeless tobacco: Not on file  . Alcohol Use: No    Allergies: No Known Allergies  Prescriptions prior to admission  Medication Sig Dispense Refill  . amLODipine (NORVASC) 10 MG tablet Take 10 mg by mouth daily.      Marland Kitchen aspirin 81 MG tablet Take 81 mg by mouth daily.      Marland Kitchen lovastatin (MEVACOR) 10 MG tablet Take 10 mg by mouth at bedtime.       Results for orders placed during the hospital encounter of 12/01/13 (from the past 48 hour(s))  URINALYSIS, ROUTINE W REFLEX MICROSCOPIC     Status: Abnormal   Collection Time    12/01/13  9:44 AM      Result Value Ref Range   Color, Urine AMBER (*) YELLOW   Comment: BIOCHEMICALS MAY BE AFFECTED BY COLOR   APPearance HAZY (*) CLEAR   Specific Gravity, Urine 1.020  1.005 - 1.030   pH 6.0  5.0 - 8.0   Glucose, UA NEGATIVE  NEGATIVE mg/dL    Hgb urine dipstick LARGE (*) NEGATIVE   Bilirubin Urine NEGATIVE  NEGATIVE   Ketones, ur NEGATIVE  NEGATIVE mg/dL   Protein, ur 119 (*) NEGATIVE mg/dL   Urobilinogen, UA 1.0  0.0 - 1.0 mg/dL   Nitrite NEGATIVE  NEGATIVE   Leukocytes, UA TRACE (*) NEGATIVE  URINE MICROSCOPIC-ADD ON     Status: Abnormal   Collection Time    12/01/13  9:44 AM      Result Value Ref Range   Squamous Epithelial / LPF MANY (*) RARE   WBC, UA 0-2  <3 WBC/hpf   RBC / HPF 0-2  <3 RBC/hpf   Bacteria, UA RARE  RARE   Crystals CA OXALATE CRYSTALS (*) NEGATIVE   Urine-Other MUCOUS PRESENT    HCG, QUANTITATIVE, PREGNANCY     Status: Abnormal   Collection Time    12/01/13 10:39 AM      Result Value Ref Range   hCG, Beta Chain, Quant, S 4979 (*) <5 mIU/mL   Comment:              GEST. AGE  CONC.  (mIU/mL)       <=1 WEEK        5 - 50         2 WEEKS       50 - 500         3 WEEKS       100 - 10,000         4 WEEKS     1,000 - 30,000         5 WEEKS     3,500 - 115,000       6-8 WEEKS     12,000 - 270,000        12 WEEKS     15,000 - 220,000                FEMALE AND NON-PREGNANT FEMALE:         LESS THAN 5 mIU/mL  CBC     Status: None   Collection Time    12/01/13 10:39 AM      Result Value Ref Range   WBC 7.9  4.0 - 10.5 K/uL   RBC 4.65  3.87 - 5.11 MIL/uL   Hemoglobin 13.4  12.0 - 15.0 g/dL   HCT 81.1  91.4 - 78.2 %   MCV 85.6  78.0 - 100.0 fL   MCH 28.8  26.0 - 34.0 pg   MCHC 33.7  30.0 - 36.0 g/dL   RDW 95.6  21.3 - 08.6 %   Platelets 365  150 - 400 K/uL   US Ob Transvaginal  12/01/2013   CLINICAL DATA:  Cramping and bleeding.  EXAM: OBSTETRIC <14 WK [place] AND TRANSVAGINAL OB US  TECHNIQUE: Both transabdominal and transvaginal ultrasound examinations were performed for complete evaluation of the gestation as well as the maternal uterus, adnexal regions, and pelvic cul-de-sac. Transvaginal technique was performed to assess early pregnancy.  COMPARISON:  None.  FINDINGS: Intrauterine gestational sac:  Irregular gestational sac noted in the lower uterine segment.  Yolk sac:  Not visualized.  Embryo:  Not visualized.  Cardiac Activity: Not visualized.  MSD:  14  mm   6 w   to  d  [place] EDC: 07/25/2014  Maternal uterus/adnexae: No significant abnormality.  No free fluid.  IMPRESSION: Irregular gestational sac noted in the lower uterine segment. No fetal pole or yolk sac visualized. Findings are suspicious but not yet definitive for failed pregnancy. Recommend follow-up US in 10-14 days for definitive diagnosis. This recommendation follows SRU consensus guidelines: Diagnostic Criteria for Nonviable Pregnancy Early in the First Trimester. Malva Limes Med 2013; 578:4696-29.   Electronically Signed   By: Maisie Fus  Register   On: 12/01/2013 12:18    Review of Systems  Constitutional: Negative for fever and chills.  Gastrointestinal: Positive for abdominal pain. Negative for nausea, vomiting, diarrhea and constipation.  Genitourinary:       No vaginal discharge. + vaginal bleeding. No dysuria.    Physical Exam   Blood pressure 125/70, pulse 83, temperature 98.5 F (36.9 C), temperature source Oral, resp. rate 18, height  (1.549 m), weight 82.781 kg (182 lb 8 oz), last menstrual period 09/17/2013.  Physical Exam  Constitutional: She is oriented to person, place, and time. She appears well-developed and well-nourished. No distress.  HENT:  Head: Normocephalic.  Eyes: Pupils are equal, round, and reactive to light.  Neck: Neck supple.  Respiratory: Effort normal.  GI: Soft. She exhibits no distension. There is no tenderness. There is no rebound  and no guarding.  Genitourinary:  Speculum exam: Vagina - Moderate amount of dark red blood in the canal. Large blood in the canal  Cervix - +active bleeding from the cervix.  Bimanual exam: Cervix closed Uterus non tender, enlarged  Adnexa non tender, no masses bilaterally Chaperone present for exam.   Musculoskeletal: Normal range of motion.   Neurological: She is alert and oriented to person, place, and time.  Skin: Skin is warm. She is not diaphoretic.  Psychiatric: Her behavior is normal.    MAU Course  Procedures None  MDM Beta hcg level  [place]  CBC Patient agreeable to cytotec Discussed with Dr. Erin Fulling  B positive blood type   Assessment and Plan   A: 1. Missed abortion    P: Discharge home in stable condition RX: Cytotec, Zofran, Ibuprofen, Vicodin  If no bleeding occurs in 12 hours, take another dose of cytotec; 1 refill given  Bleeding precautions discussed at length.  Follow up in the clinic in 1-2 weeks; message sent  Return to MAU as needed  Cancel appointment in the clinic for Monday.  Support given    Iona Hansen Damone Fancher, NP 12/01/2013 5:56 PM

## 2013-12-03 ENCOUNTER — Other Ambulatory Visit: Payer: Self-pay

## 2013-12-21 ENCOUNTER — Encounter: Payer: Self-pay | Admitting: Obstetrics and Gynecology

## 2014-01-07 ENCOUNTER — Encounter (HOSPITAL_COMMUNITY): Payer: Self-pay | Admitting: *Deleted

## 2014-04-22 ENCOUNTER — Encounter (HOSPITAL_COMMUNITY): Payer: Self-pay | Admitting: *Deleted

## 2014-04-22 ENCOUNTER — Inpatient Hospital Stay (HOSPITAL_COMMUNITY)
Admission: AD | Admit: 2014-04-22 | Discharge: 2014-04-22 | Disposition: A | Discharge status: Home / Self Care | Payer: Medicaid Other | Source: Ambulatory Visit | Attending: Obstetrics & Gynecology | Admitting: Obstetrics & Gynecology

## 2014-04-22 ENCOUNTER — Inpatient Hospital Stay (HOSPITAL_COMMUNITY): Payer: Medicaid Other

## 2014-04-22 DIAGNOSIS — R1030 Lower abdominal pain, unspecified: Secondary | ICD-10-CM | POA: Diagnosis not present

## 2014-04-22 DIAGNOSIS — Z3A13 13 weeks gestation of pregnancy: Secondary | ICD-10-CM

## 2014-04-22 DIAGNOSIS — O99331 Smoking (tobacco) complicating pregnancy, first trimester: Secondary | ICD-10-CM | POA: Insufficient documentation

## 2014-04-22 DIAGNOSIS — B9689 Other specified bacterial agents as the cause of diseases classified elsewhere: Secondary | ICD-10-CM | POA: Diagnosis not present

## 2014-04-22 DIAGNOSIS — N760 Acute vaginitis: Secondary | ICD-10-CM | POA: Insufficient documentation

## 2014-04-22 DIAGNOSIS — O4690 Antepartum hemorrhage, unspecified, unspecified trimester: Secondary | ICD-10-CM

## 2014-04-22 DIAGNOSIS — O23591 Infection of other part of genital tract in pregnancy, first trimester: Secondary | ICD-10-CM | POA: Diagnosis not present

## 2014-04-22 DIAGNOSIS — O30001 Twin pregnancy, unspecified number of placenta and unspecified number of amniotic sacs, first trimester: Secondary | ICD-10-CM | POA: Diagnosis not present

## 2014-04-22 DIAGNOSIS — F17210 Nicotine dependence, cigarettes, uncomplicated: Secondary | ICD-10-CM | POA: Insufficient documentation

## 2014-04-22 DIAGNOSIS — O209 Hemorrhage in early pregnancy, unspecified: Secondary | ICD-10-CM | POA: Diagnosis present

## 2014-04-22 DIAGNOSIS — O4692 Antepartum hemorrhage, unspecified, second trimester: Secondary | ICD-10-CM

## 2014-04-22 DIAGNOSIS — O30002 Twin pregnancy, unspecified number of placenta and unspecified number of amniotic sacs, second trimester: Secondary | ICD-10-CM

## 2014-04-22 LAB — WET PREP, GENITAL
Trich, Wet Prep: NONE SEEN
WBC WET PREP: NONE SEEN
YEAST WET PREP: NONE SEEN

## 2014-04-22 LAB — POCT PREGNANCY, URINE: PREG TEST UR: POSITIVE — AB

## 2014-04-22 LAB — URINALYSIS, ROUTINE W REFLEX MICROSCOPIC
Bilirubin Urine: NEGATIVE
Glucose, UA: NEGATIVE mg/dL
Ketones, ur: NEGATIVE mg/dL
Leukocytes, UA: NEGATIVE
Nitrite: NEGATIVE
Protein, ur: NEGATIVE mg/dL
Specific Gravity, Urine: >1.03 — ABNORMAL HIGH (ref 1.005–1.030)
Urobilinogen, UA: 0.2 mg/dL (ref 0.0–1.0)
pH: 6 (ref 5.0–8.0)

## 2014-04-22 LAB — URINE MICROSCOPIC-ADD ON

## 2014-04-22 MED ORDER — METRONIDAZOLE 500 MG PO TABS: 500.0000 mg | ORAL_TABLET | Freq: Two times a day (BID) | ORAL | Status: DC

## 2014-04-22 NOTE — MAU Note (Signed)
Miscarriage in September, is pregnant again, pos HPT last month.  Intermittent bleeding for the last 1 1/2 weeks, mild pelvic pain.

## 2014-04-22 NOTE — Discharge Instructions (Signed)
Multiple Pregnancy A multiple pregnancy is when a woman is pregnant with two or more fetuses. Multiple pregnancies occur in about 3% of all births. The more babies in a pregnancy, the greater the risks of problems to the babies and mother. This includes death. Since the use of Assisted Reproductive Technology (ART) and medications that can induce ovulation, multiple fetal gestation has increased.  RISKS TO THE MOTHER  Preeclampsia and eclampsia.  Postpartum bleeding (hemorrhage).  Kidney infection (pyelonephritis).  Develop anemia.  Develop diabetes.  Liver complications.  A blood clot blocks the artery, or branch of the artery leading to the lungs (pulmonary embolism).  Blood clots in the leg.  Placental separation.  Higher rate of Cesarean Section deliveries.  Women over 64 years old have a higher rate of Downs Syndrome babies. RISKS TO THE BABY  Preterm labor with a premature baby.  Very low birth weight babies that are less than 3 pounds, especially with triplets or mores.  Premature rupture of the membranes.  Twin to twin blood transfusion with one baby anemic and the other baby with too much blood in its system. There may also be heart failure.  With triplets or more, one of the babies is at high risk for cerebral palsy or other neurologic problem.  There is a higher incidence of fetal death. CARE OF MOTHERS WITH MULTIPLE FETAL GESTATION Multiple pregnancies need more care and special prenatal care.  You will see your caregiver more often.  You will have more tests including ultrasounds, nonstress tests and blood tests.  You will have special tests done called amniocentesis and a biophysical profile.  You may be hospitalized more often during the pregnancy.  You will be encouraged to eat a balanced and healthy diet with vitamin and mineral supplements as directed.  You will be asked to get more rest and sleep to keep up your energy.  You will be asked to  restrict your daily activities, exercise, work, household chores and sexual activity.  If you have preterm labor with small babies, you will be given a steroid injection to help the babies lungs mature and do better when born.  The delivery may have to be by Cesarean delivery, especially if there are triplets or more.  The delivery should be in a hospital with an intensive care nursery and Neonatologists (pediatrician for high risk babies) to care for the newborn babies. HOME CARE INSTRUCTIONS   Follow the caregiver's recommendations regarding office visits, tests for you and the babies, diet, rest and medications.  Avoid a large amount of physical activity.  Arrange to have help after the babies are born and when you go home from the hospital.  Take classes on how to care for multiple babies before you deliver them. SEEK IMMEDIATE MEDICAL CARE IF:   You develop a temperature of 102 F (38.9 C) or higher.  You are leaking fluid from the vagina.  You develop vaginal bleeding.  You develop uterine contractions.  You develop a severe headache, severe upper abdominal pain, visual problems or excessive swelling of your face, hands and feet.  You develop severe back pain or leg pain.  You develop severe tiredness.  You develop chest pain.  You have shortness of breath, fall down or pass out. Document Released: 12/02/2007 Document Revised: 05/17/2011 Document Reviewed: 01/26/2013 [facility] Patient Information 2015 [place], [place]. This information is not intended to replace advice given to you by your health care provider. Make sure you discuss any questions you have with  your health care provider.  Pelvic Rest Pelvic rest is sometimes recommended for women when:   The placenta is partially or completely covering the opening of the cervix (placenta previa).  There is bleeding between the uterine wall and the amniotic sac in the first trimester (subchorionic hemorrhage).  The  cervix begins to open without labor starting (incompetent cervix, cervical insufficiency).  The labor is too early (preterm labor). HOME CARE INSTRUCTIONS  Do not have sexual intercourse, stimulation, or an orgasm.  Do not use tampons, douche, or put anything in the vagina.  Do not lift anything over 10 pounds (4.5 kg).  Avoid strenuous activity or straining your pelvic muscles. SEEK MEDICAL CARE IF:  You have any vaginal bleeding during pregnancy. Treat this as a potential emergency.  You have cramping pain felt low in the stomach (stronger than menstrual cramps).  You notice vaginal discharge (watery, mucus, or bloody).  You have a low, dull backache.  There are regular contractions or uterine tightening. SEEK IMMEDIATE MEDICAL CARE IF: You have vaginal bleeding and have placenta previa.  Document Released: 06/19/2010 Document Revised: 05/17/2011 Document Reviewed: 06/19/2010 [facility] Patient Information 2015 [place], [place]. This information is not intended to replace advice given to you by your health care provider. Make sure you discuss any questions you have with your health care provider.  Vaginal Bleeding During Pregnancy, First Trimester A small amount of bleeding (spotting) from the vagina is relatively common in early pregnancy. It usually stops on its own. Various things may cause bleeding or spotting in early pregnancy. Some bleeding may be related to the pregnancy, and some may not. In most cases, the bleeding is normal and is not a problem. However, bleeding can also be a sign of something serious. Be sure to tell your health care provider about any vaginal bleeding right away. Some possible causes of vaginal bleeding during the first trimester include:  Infection or inflammation of the cervix.  Growths (polyps) on the cervix.  Miscarriage or threatened miscarriage.  Pregnancy tissue has developed outside of the uterus and in a fallopian tube (tubal  pregnancy).  Tiny cysts have developed in the uterus instead of pregnancy tissue (molar pregnancy). HOME CARE INSTRUCTIONS  Watch your condition for any changes. The following actions may help to lessen any discomfort you are feeling:  Follow your health care provider's instructions for limiting your activity. If your health care provider orders bed rest, you may need to stay in bed and only get up to use the bathroom. However, your health care provider may allow you to continue light activity.  If needed, make plans for someone to help with your regular activities and responsibilities while you are on bed rest.  Keep track of the number of pads you use each day, how often you change pads, and how soaked (saturated) they are. Write this down.  Do not use tampons. Do not douche.  Do not have sexual intercourse or orgasms until approved by your health care provider.  If you pass any tissue from your vagina, save the tissue so you can show it to your health care provider.  Only take over-the-counter or prescription medicines as directed by your health care provider.  Do not take aspirin because it can make you bleed.  Keep all follow-up appointments as directed by your health care provider. SEEK MEDICAL CARE IF:  You have any vaginal bleeding during any part of your pregnancy.  You have cramps or labor pains.  You have a fever,  not controlled by medicine. SEEK IMMEDIATE MEDICAL CARE IF:   You have severe cramps in your back or belly (abdomen).  You pass large clots or tissue from your vagina.  Your bleeding increases.  You feel light-headed or weak, or you have fainting episodes.  You have chills.  You are leaking fluid or have a gush of fluid from your vagina.  You pass out while having a bowel movement. MAKE SURE YOU:  Understand these instructions.  Will watch your condition.  Will get help right away if you are not doing well or get worse. Document Released:  12/02/2004 Document Revised: 02/27/2013 Document Reviewed: 10/30/2012 [facility] Patient Information 2015 [place]. This information is not intended to replace advice given to you by your health care provider. Make sure you discuss any questions you have with your health care provider.

## 2014-04-22 NOTE — MAU Provider Note (Signed)
History     CSN: [id number]  Arrival date and time: 04/22/14 1324   None     Chief Complaint  Patient presents with  . Vaginal Bleeding  . Pelvic Pain   HPI   [name] is a 39 y.o. female [phone] at [redacted]w[redacted]d who present with vaginal bleeding. The bleeding started 1 week ago and has been off and on.   She is also experencing lower abdominal pain that comes and goes. She has not started prenatal care as of now.   OB History    Gravida Para Term Preterm AB TAB SAB Ectopic Multiple Living   0 0 0 1      Past Medical History  Diagnosis Date  . Hypertension     Past Surgical History  Procedure Laterality Date  . Tonsillectomy    . Cesarean section      History reviewed. No pertinent family history.  History  Substance Use Topics  . Smoking status: Current Every Day Smoker -- 5 years    Types: Cigarettes  . Smokeless tobacco: Not on file  . Alcohol Use: No    Allergies: No Known Allergies  Prescriptions prior to admission  Medication Sig Dispense Refill Last Dose  . amLODipine (NORVASC) 10 MG tablet Take 10 mg by mouth daily.   11/30/2013 at Unknown time  . aspirin 81 MG tablet Take 81 mg by mouth daily.   11/30/2013 at Unknown time  . HYDROcodone-acetaminophen (NORCO/VICODIN) 5-325 MG per tablet Take 1-2 tablets by mouth every 4 (four) hours as needed for moderate pain or severe pain. 10 tablet 0   . ibuprofen (ADVIL,MOTRIN) 600 MG tablet Take 1 tablet (600 mg total) by mouth every 6 (six) hours as needed. 30 tablet 0   . lovastatin (MEVACOR) 10 MG tablet Take 10 mg by mouth at bedtime.   11/30/2013 at Unknown time  . misoprostol (CYTOTEC) 200 MCG tablet Place 4 tablets (800 mcg total) vaginally once. 4 tablet 1   . promethazine (PHENERGAN) 25 MG tablet Take 1 tablet (25 mg total) by mouth every 6 (six) hours as needed for nausea or vomiting. 15 tablet 0    Results for orders placed or performed during the hospital encounter of 04/22/14 (from the  past 48 hour(s))  Urinalysis, Routine w reflex microscopic     Status: Abnormal   Collection Time: 04/22/14 10:15 AM  Result Value Ref Range   Color, Urine YELLOW YELLOW   APPearance HAZY (A) CLEAR   Specific Gravity, Urine >1.030 (H) 1.005 - 1.030   pH 6.0 5.0 - 8.0   Glucose, UA NEGATIVE NEGATIVE mg/dL   Hgb urine dipstick LARGE (A) NEGATIVE   Bilirubin Urine NEGATIVE NEGATIVE   Ketones, ur NEGATIVE NEGATIVE mg/dL   Protein, ur NEGATIVE NEGATIVE mg/dL   Urobilinogen, UA 0.2 0.0 - 1.0 mg/dL   Nitrite NEGATIVE NEGATIVE   Leukocytes, UA NEGATIVE NEGATIVE  Urine microscopic-add on     Status: Abnormal   Collection Time: 04/22/14 10:15 AM  Result Value Ref Range   Squamous Epithelial / LPF MANY (A) RARE   WBC, UA 0-2 <3 WBC/hpf   RBC / HPF 0-2 <3 RBC/hpf   Bacteria, UA MANY (A) RARE  Pregnancy, urine POC     Status: Abnormal   Collection Time: 04/22/14 10:25 AM  Result Value Ref Range   Preg Test, Ur POSITIVE (A) NEGATIVE    Comment:        THE SENSITIVITY  OF THIS METHODOLOGY IS >24 mIU/mL    [place] Ob Comp Less 14 Wks  04/22/2014   CLINICAL DATA:  Intermittent vaginal bleeding. First trimester pregnancy.  EXAM: TWIN OBSTETRIC <14WK US AND TRANSVAGINAL OB US  COMPARISON:  None for this pregnancy.  FINDINGS: TWIN 1  Intrauterine gestational sac: 2 gestational sacs are visualized. A complete membrane is present.  Yolk sac:  Not visualized  Embryo:  Visualized  Cardiac Activity: Present  Heart Rate: 153 bpm  CRL:  60.0  Mm   12 w 4 d                  US EDC: 10/31/2014  TWIN 2  Intrauterine gestational sac: 2 gestational sacs are visualized. A complete membrane is present.  Yolk sac:  Not visualized  Embryo:  Visualized  Cardiac Activity: Present  Heart Rate: 140 bpm  CRL:  67.9  Mm   13 w 1 d                  US EDC: 10/27/2014  Maternal uterus/adnexae: Uterus is unremarkable. There is no significant free fluid. The left ovary is visualized and normal. The right ovary is not seen.   IMPRESSION: 1. Twin pregnancy. The twins appear to be diamniotic and dichorionic. 2. No complicating features.  No subchorionic hemorrhage.   Electronically Signed   By: Marin Robertshristopher  Mattern M.D.   On: 04/22/2014 12:53    Review of Systems  Constitutional: Negative for fever and chills.  Gastrointestinal: Positive for nausea, vomiting and abdominal pain.  Genitourinary: Negative for dysuria and urgency.   Physical Exam   Blood pressure 138/86, pulse 78, temperature 98.5 F (36.9 C), temperature source Oral, resp. rate 18, height 5\' 1"  (1.549 m), weight 78.472 kg (173 lb), last menstrual period 01/21/2014, unknown if currently breastfeeding.  Physical Exam  Constitutional: She is oriented to person, place, and time. She appears well-developed and well-nourished. No distress.  HENT:  Head: Normocephalic.  Eyes: Pupils are equal, round, and reactive to light.  Neck: Neck supple.  Respiratory: Effort normal.  GI: Soft. She exhibits no distension. There is no tenderness. There is no rebound and no guarding.  Genitourinary:  Speculum exam: Vagina - Small amount of creamy, dark brown blood, no odor Cervix - + active bleeding  Bimanual exam: Cervix closed Uterus non tender, enlarged  Adnexa non tender, no masses bilaterally GC/Chlam, wet prep done Chaperone present for exam.   Musculoskeletal: Normal range of motion.  Neurological: She is alert and oriented to person, place, and time.  Skin: Skin is warm. She is not diaphoretic.  Psychiatric: Her behavior is normal.    MAU Course  Procedures  None  MDM +fht by doppler  B positive blood type  US  Wet prep  GC  Assessment and Plan   A:  1. Twin gestation in first trimester   2. Vaginal bleeding in pregnancy   3.     Bacterial vaginosis   P:  Discharge home in stable condition RX: Flagyl  Start prenatal ASAP Pelvic rest Return to MAU if symptoms worsen Bleeding precautions.    Jennifer HansenJennifer Irene Artavia Jeanlouis,  NP 04/22/2014 11:45 AM

## 2014-04-23 LAB — GC/CHLAMYDIA PROBE AMP (~~LOC~~) NOT AT ARMC
Chlamydia: NEGATIVE
Neisseria Gonorrhea: NEGATIVE

## 2014-07-03 LAB — PROCEDURE REPORT - SCANNED: PAP SMEAR: NEGATIVE

## 2014-07-05 ENCOUNTER — Other Ambulatory Visit (HOSPITAL_COMMUNITY): Payer: Self-pay | Admitting: Obstetrics

## 2014-07-05 DIAGNOSIS — Z1231 Encounter for screening mammogram for malignant neoplasm of breast: Secondary | ICD-10-CM

## 2014-07-12 ENCOUNTER — Ambulatory Visit (HOSPITAL_COMMUNITY): Payer: Medicaid Other

## 2014-07-12 ENCOUNTER — Ambulatory Visit
Admission: RE | Admit: 2014-07-12 | Discharge: 2014-07-12 | Disposition: A | Discharge status: Home / Self Care | Payer: Medicaid Other | Source: Ambulatory Visit

## 2014-07-12 ENCOUNTER — Other Ambulatory Visit: Payer: Self-pay

## 2014-07-12 DIAGNOSIS — Z1231 Encounter for screening mammogram for malignant neoplasm of breast: Secondary | ICD-10-CM

## 2015-01-11 ENCOUNTER — Encounter (HOSPITAL_COMMUNITY): Payer: Self-pay | Admitting: Emergency Medicine

## 2015-01-11 ENCOUNTER — Emergency Department (HOSPITAL_COMMUNITY)
Admission: EM | Admit: 2015-01-11 | Discharge: 2015-01-11 | Disposition: A | Discharge status: Home / Self Care | Payer: Medicaid Other | Attending: Emergency Medicine | Admitting: Emergency Medicine

## 2015-01-11 DIAGNOSIS — Z720 Tobacco use: Secondary | ICD-10-CM | POA: Insufficient documentation

## 2015-01-11 DIAGNOSIS — L299 Pruritus, unspecified: Secondary | ICD-10-CM | POA: Diagnosis not present

## 2015-01-11 DIAGNOSIS — Z792 Long term (current) use of antibiotics: Secondary | ICD-10-CM | POA: Diagnosis not present

## 2015-01-11 DIAGNOSIS — R21 Rash and other nonspecific skin eruption: Secondary | ICD-10-CM | POA: Diagnosis not present

## 2015-01-11 DIAGNOSIS — Z79899 Other long term (current) drug therapy: Secondary | ICD-10-CM | POA: Insufficient documentation

## 2015-01-11 DIAGNOSIS — R110 Nausea: Secondary | ICD-10-CM | POA: Insufficient documentation

## 2015-01-11 DIAGNOSIS — I10 Essential (primary) hypertension: Secondary | ICD-10-CM | POA: Diagnosis not present

## 2015-01-11 MED ORDER — DIPHENHYDRAMINE HCL 25 MG PO TABS: 25.0000 mg | ORAL_TABLET | Freq: Four times a day (QID) | ORAL | Status: DC

## 2015-01-11 MED ORDER — IBUPROFEN 800 MG PO TABS: 800.0000 mg | ORAL_TABLET | Freq: Three times a day (TID) | ORAL | Status: DC

## 2015-01-11 NOTE — ED Notes (Signed)
Pt c/o rash to L side of abdomen. Started with primary lesion to L abdomen. Pt states rash is burning and irritated with intermittent pain. Denies use of new lotions, creams, detergents. No contacts at home with rash. Pt denies other symptoms. No acute distress.

## 2015-01-11 NOTE — ED Provider Notes (Signed)
History  By signing my name below, I, Karle PlumberJennifer Tensley, attest that this documentation has been prepared under the direction and in the presence of Glean HessElizabeth Girtha Kilgore, [place]ronogoPA-C. Electronically Signed: Karle PlumberJennifer Tensley, ED Scribe. 01/11/2015. 5:37 PM.  Chief Complaint  Patient presents with  . Rash    The history is provided by the patient and medical records. No language interpreter was used.     HPI Comments:  Jennifer Ayers is a 39 y.o. female who presents to the ED with rash to the left side of her abdomen, which she noticed last night. She reports associated mild nausea and itching. She states the rash started with a single, painful, swollen lesion about two weeks ago, at which time she thought she was bitten by an insect. She reports applying salve to the area with no significant symptom relief. She denies using new lotions, creams, detergents or other personal hygiene products. She denies new foods or new medications. She denies fever, chills, abdominal pain, vomiting.   Past Medical History  Diagnosis Date  . Hypertension    Past Surgical History  Procedure Laterality Date  . Tonsillectomy    . Cesarean section     No family history on file. Social History  Substance Use Topics  . Smoking status: Current Every Day Smoker -- 5 years    Types: Cigarettes  . Smokeless tobacco: None  . Alcohol Use: No   OB History    Gravida Para Term Preterm AB TAB SAB Ectopic Multiple Living   5 1 1  0 3 1 2  0 0 1      Review of Systems  Constitutional: Negative for fever and chills.  Respiratory: Negative for shortness of breath.   Cardiovascular: Negative for chest pain.  Gastrointestinal: Positive for nausea. Negative for vomiting, abdominal pain and diarrhea.  Musculoskeletal: Negative for myalgias and arthralgias.  Skin: Positive for rash.  Neurological: Negative for dizziness, weakness, light-headedness, numbness and headaches.    Allergies  Review of patient's allergies  indicates no known allergies.  Home Medications   Prior to Admission medications   Medication Sig Start Date End Date Taking? Authorizing Provider  amLODipine (NORVASC) 10 MG tablet Take 10 mg by mouth daily.    Historical Provider, MD  diphenhydrAMINE (BENADRYL) 25 MG tablet Take 1 tablet (25 mg total) by mouth every 6 (six) hours. 01/11/15   Mady GemmaElizabeth C Derrion Tritz, PA-C  ibuprofen (ADVIL,MOTRIN) 800 MG tablet Take 1 tablet (800 mg total) by mouth 3 (three) times daily. 01/11/15   Mady GemmaElizabeth C Din Bookwalter, PA-C  metroNIDAZOLE (FLAGYL) 500 MG tablet Take 1 tablet (500 mg total) by mouth 2 (two) times daily. 04/22/14   Duane LopeJennifer I Rasch, NP  Prenatal Vit-Fe Fumarate-FA (PRENATAL MULTIVITAMIN) TABS tablet Take 1 tablet by mouth daily at 12 noon.    Historical Provider, MD    Triage Vitals: BP 179/118 mmHg  Pulse 94  Temp(Src) 98.6 F (37 C) (Oral)  Resp 18  SpO2 100%  LMP 01/21/2014 Physical Exam  Constitutional: She is oriented to person, place, and time. She appears well-developed and well-nourished. No distress.  HENT:  Head: Normocephalic and atraumatic.  Right Ear: External ear normal.  Left Ear: External ear normal.  Nose: Nose normal.  Mouth/Throat: Uvula is midline, oropharynx is clear and moist and mucous membranes are normal.  Eyes: Conjunctivae, EOM and lids are normal. Pupils are equal, round, and reactive to light. Right eye exhibits no discharge. Left eye exhibits no discharge. No scleral icterus.  Neck: Normal range of  motion. Neck supple.  Cardiovascular: Normal rate, regular rhythm, normal heart sounds, intact distal pulses and normal pulses.   Pulmonary/Chest: Effort normal and breath sounds normal. No respiratory distress. She has no wheezes. She has no rales.  Abdominal: Soft. Normal appearance and bowel sounds are normal. She exhibits no distension and no mass. There is no tenderness. There is no rigidity, no rebound and no guarding.  Musculoskeletal: Normal range of motion.  She exhibits no edema or tenderness.  Neurological: She is alert and oriented to person, place, and time.  Skin: Skin is warm, dry and intact. Rash noted. She is not diaphoretic. No erythema. No pallor.  1 cm area of induration to left flank with overlying purple color. No fluctuance. No significant TTP. Erythematous papular rash to left flank. No evidence of infection. No vesicles. No skin sloughing.   Psychiatric: She has a normal mood and affect. Her speech is normal and behavior is normal.  Nursing note and vitals reviewed.   ED Course  Procedures (including critical care time)  DIAGNOSTIC STUDIES: Oxygen Saturation is 100% on RA, normal by my interpretation.   COORDINATION OF CARE: 5:36 PM- Reassured patient that there is no evidence of infection. Advised her to take benadryl for possible allergic reaction. Will order ibuprofen for pain prior to discharge and told pt to follow up with her PCP. Pt verbalizes understanding and agrees to plan.  Medications - No data to display  Labs Review Labs Reviewed - No data to display  Imaging Review No results found.   I have personally reviewed and evaluated these images and lab results as part of my medical decision-making.   EKG Interpretation None      MDM   Final diagnoses:  Rash    39 year old female presents with rash to her left flank, which she noticed last night. Also reports lesion to her left flank that has been present x 2 weeks, which she attributes to insect bite. Reports associated itching. Denies using new lotions, creams, detergents or other personal hygiene products. Denies new foods or new medications. Denies fever, chills, headache, lightheadedness, dizziness, arthralgias, myalgias, abdominal pain, vomiting.   Patient is afebrile. Hypertensive, though patient states she has not taken her BP medications over the past 3 days. 1 cm area of induration to left flank with overlying purple color. No fluctuance. No  significant TTP. Erythematous papular rash to left flank. No evidence of infection. No vesicles. No skin sloughing. Posterior oropharynx without erythema, edema, or exudate. Heart RRR. Lungs clear to auscultation bilaterally. Abdomen soft, non-tender, non-distended. Patient moves all extremities and ambulates without difficulty.  Low suspicion for infection, drug reaction, herpes zoster. Symptoms likely related to contact or allergic dermatitis - may be due to applying salve to initial lesion. Will treat with benadryl. Patient to follow-up with PCP this week. Return precautions discussed at length. Patient verbalizes her understanding and is in agreement with plan.   BP 172/102 mmHg  Pulse 84  Temp(Src) 98.6 F (37 C) (Oral)  Resp 18  SpO2 100%  LMP 01/21/2014   I personally performed the services described in this documentation, which was scribed in my presence. The recorded information has been reviewed and is accurate.    Mady Gemma, PA-C 01/12/15 0004  Lyndal Pulley, MD 01/17/15 1723

## 2015-01-11 NOTE — Discharge Instructions (Signed)
1. Medications: benadryl, usual home medications 2. Treatment: rest, drink plenty of fluids 3. Follow Up: please followup with your primary doctor for discussion of your diagnoses and further evaluation after today's visit; if you do not have a primary care doctor use the resource guide provided to find one; please return to the ER for fever, chills, abdominal pain, persistent vomiting, new or worsening symptoms   Emergency Department Resource Guide 1) Find a Doctor and Pay Out of Pocket Although you won't have to find out who is covered by your insurance plan, it is a good idea to ask around and get recommendations. You will then need to call the office and see if the doctor you have chosen will accept you as a new patient and what types of options they offer for patients who are self-pay. Some doctors offer discounts or will set up payment plans for their patients who do not have insurance, but you will need to ask so you aren't surprised when you get to your appointment.  2) Contact Your Local Health Department Not all health departments have doctors that can see patients for sick visits, but many do, so it is worth a call to see if yours does. If you don't know where your local health department is, you can check in your phone book. The CDC also has a tool to help you locate your state's health department, and many state websites also have listings of all of their local health departments.  3) Find a Walk-in Clinic If your illness is not likely to be very severe or complicated, you may want to try a walk in clinic. These are popping up all over the country in pharmacies, drugstores, and shopping centers. They're usually staffed by nurse practitioners or physician assistants that have been trained to treat common illnesses and complaints. They're usually fairly quick and inexpensive. However, if you have serious medical issues or chronic medical problems, these are probably not your best  option.  No Primary Care Doctor: - Call Health Connect at  [phone] - they can help you locate a primary care doctor that  accepts your insurance, provides certain services, etc. - Physician Referral Service- [phone]  Chronic Pain Problems: Organization         Address  Phone   Notes  [facility]  [phone] Patients need to be referred by their primary care doctor.   Medication Assistance: Organization         Address  Phone   Notes  [place] [facility] [address] [place], [place] [address] [phone] --Must be a resident of [place] -- Must have NO insurance coverage whatsoever (no Medicaid/ Medicare, etc.) -- The pt. MUST have a primary care doctor that directs their care regularly and follows them in the community   MedAssist  [phone]   [employer]  [phone]    Agencies that provide inexpensive medical care: Organization         Address  Phone   Notes  [facility]  [phone]   [facility]    [phone]   [facility] [address] [place], [place] [address] [phone]   [facility] [address], [place] [phone]   [facility]anned Parenthood    [phone]   [facility]    [phone]   [facility]  [address], [place] Phone:  [phone], Fax:  [phone] Hours of Operation:  9 am - 6 pm, M-F.  Also accepts Medicaid/Medicare and self-pay.  [facility] for [place], [address], [place] Phone: [phone], Fax: [phone]. Hours of Operation:  8:30 am - 5:30 pm, M-F.  Also accepts Medicaid and self-pay.  [facility] [place] [address] [place] Phone: [phone]   [place], [place], [place] [phone], Ext. [phone] Mondays & Thursdays: 7-9 AM.  First 15  patients are seen on a first come, first serve basis.    [place] Providers:  Organization         Address  Phone   Notes  [facility] [address], [place] [phone] Also accepts self-pay patients.  [facility] [address], [place]  [phone]   [place], [address], [place] [phone]   [facility] [address], [place] [phone]   Lucianne Lei [address], [place]   [phone] Only accepts [place] Access [place] patients after they have their name applied to their card.   Self-Pay (no insurance) in [facility]:  Organization         Address  Phone   Notes  Sickle Cell Patients, [facility] [place] [phone]   [facility] [place] [phone]   [facility] [place]  [place], [place], [place] [phone]   [facility]/Dr. Osei-Bonsu  [address], [place] or [address], [place] [phone] Phone number for both [place] and [place] locations is the same.  [facility] [address] [place] [phone]   [facility] [address], [place] or [address] [phone]   [facility] [address] [place] [phone], phone; [phone], fax Sees patients 1st and 3rd Saturday of every month.  Must not qualify for public or private insurance (i.e. Medicaid, Medicare, [place] Health Choice, Veterans' Benefits)  Household income should be no more than 200% of the poverty level The clinic cannot treat you if you are pregnant or think you are pregnant  Sexually transmitted diseases are not treated at the clinic.    Dental  Care: Organization         Address  Phone  Notes  [facility] of [facility] [place] [phone] Accepts children up to age 58 who are enrolled in [place] or [place]; pregnant women with a Medicaid card; and children who have applied for Medicaid or [place] Health Choice, but were declined, whose parents can pay a reduced fee at time of service.  [facility] of [facility]  [address], [place] [phone] Accepts children up to age 82 who are enrolled in [place] or [place]; pregnant women with a Medicaid card; and children who have applied for Medicaid or  Health Choice, but were declined, whose parents can pay a reduced fee at time of service.  [facility] Adult Dental Access PROGRAM  [place] [phone] Patients are  seen by appointment only. Walk-ins are not accepted. [place] will see patients 55 years of age and older. Monday - Tuesday (8am-5pm) Most Wednesdays (8:30-5pm) $30 per visit, cash only  [facility] Adult Dental Access PROGRAM  [address], [facility] [phone] Patients are seen by appointment only. Walk-ins are not accepted. [place] will see patients 42 years of age and older. One Wednesday Evening (Monthly: Volunteer Based).  $30 per visit, cash only  [place]  [phone] for adults; Children under age 42, call Graduate Pediatric Dentistry at [phone]. Children aged 37-14, please call [phone] to request a pediatric application.  Dental services are provided in all areas of dental care including fillings, crowns and bridges, complete and partial dentures, implants, gum treatment, root canals, and extractions. Preventive care is also provided. Treatment is provided to both adults and children. Patients are selected via a lottery and there is often a waiting list.   [facility] [address] [place]  [phone] [email or web address]   [facility] [address] [place], [place] [phone], Ext. [phone] Second and Fourth Thursday of each month, opens at 6:30 AM; Clinic ends at 9 AM.  Patients are seen on a first-come first-served basis, and a limited number are seen during each clinic.   [facility]  [address] Hillard Danker [place], [place] [phone]   Eligibility Requirements You must have lived in [place], [place], or [place] counties for at least the last three months.   You cannot be eligible for state or federal sponsored [employer], including [employer], [place], or [employer].   You generally cannot be eligible for healthcare insurance through your employer.    How to apply: Eligibility screenings are held every Tuesday and Wednesday afternoon from 1:00 pm until 4:00 pm. You do not need an appointment for the interview!  [facility] [address], [place], [place]   [place]  [place] Department  [place]  [phone]    Behavioral Health Resources in the Community: Intensive Outpatient Programs Organization         Address  Phone  Notes  [place]. [address], [place], [place] [phone]   [facility] [address] [place], [place]   ADS: Alcohol & Drug Svcs [address], [place], [place]   [place] [address]  [place], [place] or [phone]   Substance Abuse Resources Organization         Address  Phone  Notes  Alcohol and Drug Services  [phone]   [place]  [phone]   [facility] [place]   Chinita Pester  [phone]   Residential & Outpatient Substance Abuse Program  [phone]    Psychological Services Organization         Address  Phone  Notes  [facility] [place]  [place]  [phone]   [place] [address] [place] or [phone]    Mobile Crisis Teams Organization         Address  Phone  Notes  [facility], Mobile Crisis Care Unit  [phone]   Assertive Psychotherapeutic Services  [address] [place], [place]   [facility] [address] [place]  [place] [phone]    Self-Help/Support Groups Organization         Address  Phone             Notes  Mental Health Assoc. of [place] - variety of support groups  [place] Call for more information  Narcotics Anonymous (NA), [facility] [address], [employer] [place]  2 meetings at this location   Special educational needs teacher         Address  Phone  Notes  [facility]AP Residential Treatment [place],    [place]  [phone]   [facility]  [address] [place] [phone], [place], [place]   [place], [place] [phone] Admissions: 8am-3pm M-F  Incentives Substance [place] [address],    [place], [place] [phone]   [facility] [address] [place], [place], [place]   [facility] [address],  [place], [place]   [facility] Programs - Intensive Outpatient [address]een [address], [place], [place]   [facility] ([place].,  [place], [place] [phone] or [phone]   [facility] (RTS) [address], [place], [place] Accepts Medicaid  [facility] [place] [address],  [place] [phone] Substance Abuse/Addiction Treatment   [facility] Organization         Address  Phone  Notes  [facility]  [phone]   Domenic Schwab, PhD [address] [place], [place]   [phone] or [phone]   [place], [place] [phone]   [facility] [address], [place], [place] [phone] Insurance/Medicaid/sponsorship through [facility] and Families [address], [address] [place]                                    [place], [place] [phone] [place] [address], [place] [phone]    Dr. Adele Schilder  [phone]   [facility] Dept. 1) [address], [place] 2) [place] 3)  [place] [address], [place] [phone]  [phone]   [place] [phone] or [phone] (After Hours)

## 2015-07-06 ENCOUNTER — Encounter (HOSPITAL_COMMUNITY): Payer: Self-pay | Admitting: Emergency Medicine

## 2015-07-06 ENCOUNTER — Emergency Department (HOSPITAL_COMMUNITY)
Admission: EM | Admit: 2015-07-06 | Discharge: 2015-07-06 | Disposition: A | Discharge status: Home / Self Care | Payer: Medicaid Other | Attending: Emergency Medicine | Admitting: Emergency Medicine

## 2015-07-06 DIAGNOSIS — Z791 Long term (current) use of non-steroidal anti-inflammatories (NSAID): Secondary | ICD-10-CM | POA: Insufficient documentation

## 2015-07-06 DIAGNOSIS — R21 Rash and other nonspecific skin eruption: Secondary | ICD-10-CM | POA: Diagnosis present

## 2015-07-06 DIAGNOSIS — R110 Nausea: Secondary | ICD-10-CM | POA: Insufficient documentation

## 2015-07-06 DIAGNOSIS — Z79899 Other long term (current) drug therapy: Secondary | ICD-10-CM | POA: Diagnosis not present

## 2015-07-06 DIAGNOSIS — Z792 Long term (current) use of antibiotics: Secondary | ICD-10-CM | POA: Diagnosis not present

## 2015-07-06 DIAGNOSIS — L239 Allergic contact dermatitis, unspecified cause: Secondary | ICD-10-CM | POA: Insufficient documentation

## 2015-07-06 DIAGNOSIS — I10 Essential (primary) hypertension: Secondary | ICD-10-CM | POA: Insufficient documentation

## 2015-07-06 DIAGNOSIS — F17210 Nicotine dependence, cigarettes, uncomplicated: Secondary | ICD-10-CM | POA: Insufficient documentation

## 2015-07-06 MED ORDER — DIPHENHYDRAMINE HCL 25 MG PO TABS: 50.0000 mg | ORAL_TABLET | Freq: Four times a day (QID) | ORAL | Status: DC

## 2015-07-06 MED ORDER — PREDNISONE 10 MG PO TABS: ORAL_TABLET | ORAL | Status: DC

## 2015-07-06 MED ORDER — HYDROCORTISONE 1 % EX CREA: TOPICAL_CREAM | CUTANEOUS | Status: DC

## 2015-07-06 NOTE — ED Provider Notes (Signed)
CSN: [id number]     Arrival date & time 07/06/15  1751 History   First MD Initiated Contact with Patient 07/06/15 1947     Chief Complaint  Patient presents with  . Rash     (Consider location/radiation/quality/duration/timing/severity/associated sxs/prior Treatment) Patient is a 40 y.o. female presenting with rash. The history is provided by the patient.  Rash Location:  Ano-genital, torso and shoulder/arm Shoulder/arm rash location:  R arm and L arm Torso rash location:  Lower back, upper back, L flank and R flank Ano-genital rash location:  Anus and L buttock Quality: itchiness and redness   Quality: not painful   Severity:  Moderate Onset quality:  Gradual Duration:  2 days Timing:  Constant Progression:  Worsening Chronicity:  New Context: not animal contact and not hot tub use   Relieved by:  Nothing Worsened by:  Nothing tried Associated symptoms: nausea   Associated symptoms: no abdominal pain, no fever, no joint pain, no throat swelling, no tongue swelling and not vomiting     Past Medical History  Diagnosis Date  . Hypertension    Past Surgical History  Procedure Laterality Date  . Tonsillectomy    . Cesarean section     History reviewed. No pertinent family history. Social History  Substance Use Topics  . Smoking status: Current Every Day Smoker -- 5 years    Types: Cigarettes  . Smokeless tobacco: None  . Alcohol Use: No   OB History    Gravida Para Term Preterm AB TAB SAB Ectopic Multiple Living   5 1 1  0 3 1 2  0 0 1     Review of Systems  Constitutional: Negative for fever.  Gastrointestinal: Positive for nausea. Negative for vomiting and abdominal pain.  Musculoskeletal: Negative for arthralgias.  Skin: Positive for rash.  All other systems reviewed and are negative.     Allergies  Review of patient's allergies indicates no known allergies.  Home Medications   Prior to Admission medications   Medication Sig Start Date End Date  Taking? Authorizing Provider  amLODipine (NORVASC) 10 MG tablet Take 10 mg by mouth daily.   Yes Historical Provider, MD  diphenhydrAMINE (BENADRYL) 25 MG tablet Take 1 tablet (25 mg total) by mouth every 6 (six) hours. 01/11/15  Yes Mady GemmaElizabeth C [place]fall, PA-C  ibuprofen (ADVIL,MOTRIN) 800 MG tablet Take 1 tablet (800 mg total) by mouth 3 (three) times daily. 01/11/15   Mady GemmaElizabeth C [place]fall, PA-C  metroNIDAZOLE (FLAGYL) 500 MG tablet Take 1 tablet (500 mg total) by mouth 2 (two) times daily. 04/22/14   Duane LopeJennifer I Rasch, NP  Prenatal Vit-Fe Fumarate-FA (PRENATAL MULTIVITAMIN) TABS tablet Take 1 tablet by mouth daily at 12 noon.    Historical Provider, MD   BP 177/107 mmHg  Pulse 89  Temp(Src) 98.5 F (36.9 C) (Oral)  Resp 14  SpO2 100% Physical Exam  Constitutional: She is oriented to person, place, and time. She appears well-developed and well-nourished. No distress.  HENT:  Head: Normocephalic.  Eyes: Conjunctivae are normal.  Neck: Neck supple. No tracheal deviation present.  Cardiovascular: Normal rate and regular rhythm.   Pulmonary/Chest: Effort normal. No respiratory distress.  Abdominal: Soft. She exhibits no distension.  Neurological: She is alert and oriented to person, place, and time.  Skin: Skin is warm and dry. Rash (diffuse papular erythematous pruritic lesions over trunk and extremities sparing distal portions and face) noted.  Psychiatric: She has a normal mood and affect.    ED Course  Procedures (  including critical care time) Labs Review Labs Reviewed - No data to display  Imaging Review No results found. I have personally reviewed and evaluated these images and lab results as part of my medical decision-making.   EKG Interpretation None      MDM   Final diagnoses:  Allergic dermatitis    40 y.o. female presents with what appears to be allergic dermatitis from unknown exposure. Has had similar symptoms before. Now symptoms including perianal area which is  uncomfortable for her. D/t extent of spread she was given a steroid taper and recommended hydrocortisone cream and scheduled benadryl. Would recommend keeping a log and allergy follow up routinely. Plan to follow up with PCP as needed and return precautions discussed for worsening or new concerning symptoms.     Lyndal Pulley, MD 07/07/15 [phone]

## 2015-07-06 NOTE — Discharge Instructions (Signed)

## 2015-07-06 NOTE — ED Notes (Signed)
Pt reports she has an itchy rash that started last night. Began on lower back last night. Rash has gradually moved onto buttocks, anal area, vaginal area, bilateral thighs and bilateral wrists. No new body products.

## 2015-12-02 ENCOUNTER — Encounter: Payer: Self-pay | Admitting: *Deleted

## 2016-04-12 ENCOUNTER — Encounter (HOSPITAL_COMMUNITY): Payer: Self-pay | Admitting: Emergency Medicine

## 2016-04-12 ENCOUNTER — Emergency Department (HOSPITAL_COMMUNITY)
Admission: EM | Admit: 2016-04-12 | Discharge: 2016-04-12 | Disposition: A | Discharge status: Home / Self Care | Payer: Medicaid Other | Attending: Emergency Medicine | Admitting: Emergency Medicine

## 2016-04-12 DIAGNOSIS — I10 Essential (primary) hypertension: Secondary | ICD-10-CM | POA: Insufficient documentation

## 2016-04-12 DIAGNOSIS — N760 Acute vaginitis: Secondary | ICD-10-CM | POA: Insufficient documentation

## 2016-04-12 DIAGNOSIS — B9689 Other specified bacterial agents as the cause of diseases classified elsewhere: Secondary | ICD-10-CM | POA: Insufficient documentation

## 2016-04-12 DIAGNOSIS — F17210 Nicotine dependence, cigarettes, uncomplicated: Secondary | ICD-10-CM | POA: Insufficient documentation

## 2016-04-12 LAB — WET PREP, GENITAL
Sperm: NONE SEEN
Trich, Wet Prep: NONE SEEN
WBC, Wet Prep HPF POC: NONE SEEN
Yeast Wet Prep HPF POC: NONE SEEN

## 2016-04-12 LAB — POC URINE PREG, ED: Preg Test, Ur: NEGATIVE

## 2016-04-12 MED ORDER — METRONIDAZOLE 500 MG PO TABS: 500.0000 mg | ORAL_TABLET | Freq: Two times a day (BID) | ORAL | 0 refills | Status: DC

## 2016-04-12 NOTE — ED Notes (Signed)
Pelvic setup at bedside.

## 2016-04-12 NOTE — ED Triage Notes (Signed)
Pt verbalizes vaginal discharge, odor, and pain onset 3 days ago.

## 2016-04-12 NOTE — ED Provider Notes (Signed)
[facility]-EMERGENCY DEPT Provider Note   CSN: [id number] Arrival date & time: 04/12/16  1335     History   Chief Complaint Chief Complaint  Patient presents with  . Vaginal Discharge    HPI [name] is a 41 y.o. female.  HPI Patient complains of new vaginal discharge and odor over the past 3 days.  Denies abdominal pain.  No fevers or chills.  Denies nausea vomiting.  New sexual contact 2 weeks ago.  None since then.  No dysuria or urinary frequency.  No low back pain    Past Medical History:  Diagnosis Date  . Hypertension     There are no active problems to display for this patient.   Past Surgical History:  Procedure Laterality Date  . CESAREAN SECTION    . TONSILLECTOMY      OB History    Gravida Para Term Preterm AB Living   5 1 1  0 3 1   SAB TAB Ectopic Multiple Live Births   2 1 0 0 1       Home Medications    Prior to Admission medications   Medication Sig Start Date End Date Taking? Authorizing Provider  amLODipine (NORVASC) 10 MG tablet Take 10 mg by mouth daily.   Yes Historical Provider, MD  etonogestrel (NEXPLANON) 68 MG IMPL implant 1 each by Subdermal route once.   Yes Historical Provider, MD  Homeopathic Products (AZO YEAST PLUS PO) Take 1 tablet by mouth daily.   Yes Historical Provider, MD  lisinopril (PRINIVIL,ZESTRIL) 10 MG tablet Take 10 mg by mouth daily.   Yes Historical Provider, MD  loratadine (CLARITIN) 10 MG tablet Take 10 mg by mouth daily.   Yes Historical Provider, MD  Phenylephrine-Ibuprofen (ADVIL CONGESTION RELIEF) 10-200 MG TABS Take 1 tablet by mouth as needed (congestion).   Yes Historical Provider, MD    Family History No family history on file.  Social History Social History  Substance Use Topics  . Smoking status: Current Every Day Smoker    Years: 5.00    Types: Cigarettes  . Smokeless tobacco: Not on file  . Alcohol use No     Allergies   Patient has no known allergies.   Review of Systems Review of  Systems  All other systems reviewed and are negative.    Physical Exam Updated Vital Signs BP 193/100 (BP Location: Left Arm)   Pulse 92   Temp 98 F (36.7 C) (Oral)   Resp 18   SpO2 100%   Physical Exam  Constitutional: She is oriented to person, place, and time. She appears well-developed and well-nourished.  HENT:  Head: Normocephalic.  Eyes: EOM are normal.  Neck: Normal range of motion.  Pulmonary/Chest: Effort normal.  Abdominal: She exhibits no distension. There is no tenderness.  Genitourinary:  Genitourinary Comments: Normal external genitalia.  Mild white cervical and vaginal discharge without cervical motion tenderness.  No cervical erosions.  No vaginal bleeding.  Chaperone present  Musculoskeletal: Normal range of motion.  Neurological: She is alert and oriented to person, place, and time.  Psychiatric: She has a normal mood and affect.  Nursing note and vitals reviewed.    ED Treatments / Results  Labs (all labs ordered are listed, but only abnormal results are displayed) Labs Reviewed  WET PREP, GENITAL - Abnormal; Notable for the following:       Result Value   Clue Cells Wet Prep HPF POC PRESENT (*)    All other components within normal  limits  POC URINE PREG, ED  GC/CHLAMYDIA PROBE AMP ([place]) NOT AT [facility]    EKG  EKG Interpretation None       Radiology No results found.  Procedures Procedures (including critical care time)  Medications Ordered in ED Medications - No data to display   Initial Impression / Assessment and Plan / ED Course  I have reviewed the triage vital signs and the nursing notes.  Pertinent labs & imaging results that were available during my care of the patient were reviewed by me and considered in my medical decision making (see chart for details).     patient be treated for bacterial vaginosis.  Gonorrhea and Chlamydia swabs are pending.  Patient will be treated with Flagyl 7 days.  No signs of PID.   Well-appearing.  Patient understands to return to the ER for new or worsening symptoms  Final Clinical Impressions(s) / ED Diagnoses   Final diagnoses:  None    New Prescriptions New Prescriptions   No medications on file     Azalia Bilis, MD 04/12/16 1932

## 2016-04-13 LAB — GC/CHLAMYDIA PROBE AMP (~~LOC~~) NOT AT ARMC
Chlamydia: NEGATIVE
NEISSERIA GONORRHEA: NEGATIVE

## 2016-07-06 ENCOUNTER — Encounter: Payer: Self-pay | Admitting: Family Medicine

## 2016-07-06 ENCOUNTER — Ambulatory Visit (INDEPENDENT_AMBULATORY_CARE_PROVIDER_SITE_OTHER): Payer: Self-pay | Admitting: Family Medicine

## 2016-07-06 VITALS — BP 183/101 | HR 84 | Temp 98.3°F | Resp 16 | Ht 61.0 in | Wt 184.0 lb

## 2016-07-06 DIAGNOSIS — R0989 Other specified symptoms and signs involving the circulatory and respiratory systems: Secondary | ICD-10-CM

## 2016-07-06 DIAGNOSIS — I10 Essential (primary) hypertension: Secondary | ICD-10-CM

## 2016-07-06 LAB — COMPLETE METABOLIC PANEL WITH GFR
ALBUMIN: 3.9 g/dL (ref 3.6–5.1)
ALK PHOS: 70 U/L (ref 33–115)
ALT: 10 U/L (ref 6–29)
AST: 13 U/L (ref 10–30)
BILIRUBIN TOTAL: 0.7 mg/dL (ref 0.2–1.2)
BUN: 14 mg/dL (ref 7–25)
CO2: 24 mmol/L (ref 20–31)
CREATININE: 1.19 mg/dL — AB (ref 0.50–1.10)
Calcium: 9.1 mg/dL (ref 8.6–10.2)
Chloride: 108 mmol/L (ref 98–110)
GFR, Est African American: 66 mL/min (ref >=60)
GFR, Est Non African American: 57 mL/min — ABNORMAL LOW (ref >=60)
GLUCOSE: 85 mg/dL (ref 65–99)
Potassium: 3.8 mmol/L (ref 3.5–5.3)
SODIUM: 141 mmol/L (ref 135–146)
TOTAL PROTEIN: 7 g/dL (ref 6.1–8.1)

## 2016-07-06 LAB — THYROID PANEL WITH TSH
FREE THYROXINE INDEX: 2.2 (ref 1.4–3.8)
T3 Uptake: 30 % (ref 22–35)
T4 TOTAL: 7.4 ug/dL (ref 4.5–12.0)
TSH: 0.62 m[IU]/L

## 2016-07-06 LAB — CBC WITH DIFFERENTIAL/PLATELET
BASOS ABS: 73 {cells}/uL (ref 0–200)
BASOS PCT: 1 %
EOS PCT: 3 %
Eosinophils Absolute: 219 cells/uL (ref 15–500)
HCT: 44.2 % (ref 35.0–45.0)
HEMOGLOBIN: 14.8 g/dL (ref 11.7–15.5)
Lymphocytes Relative: 41 %
Lymphs Abs: 2993 cells/uL (ref 850–3900)
MCH: 30.5 pg (ref 27.0–33.0)
MCHC: 33.5 g/dL (ref 32.0–36.0)
MCV: 90.9 fL (ref 80.0–100.0)
MPV: 10 fL (ref 7.5–12.5)
Monocytes Absolute: 438 cells/uL (ref 200–950)
Monocytes Relative: 6 %
Neutro Abs: 3577 cells/uL (ref 1500–7800)
Neutrophils Relative %: 49 %
Platelets: 291 10*3/uL (ref 140–400)
RBC: 4.86 MIL/uL (ref 3.80–5.10)
RDW: 14.7 % (ref 11.0–15.0)
WBC: 7.3 10*3/uL (ref 3.8–10.8)

## 2016-07-06 LAB — POCT URINALYSIS DIP (DEVICE)
Bilirubin Urine: NEGATIVE
Glucose, UA: NEGATIVE mg/dL
KETONES UR: NEGATIVE mg/dL
LEUKOCYTES UA: NEGATIVE
NITRITE: NEGATIVE
Protein, ur: 100 mg/dL — AB
Specific Gravity, Urine: >=1.03 (ref 1.005–1.030)
Urobilinogen, UA: 0.2 mg/dL (ref 0.0–1.0)
pH: 5.5 (ref 5.0–8.0)

## 2016-07-06 LAB — LIPID PANEL
CHOL/HDL RATIO: 6.5 ratio — AB (ref <5.0)
CHOLESTEROL: 226 mg/dL — AB (ref <200)
HDL: 35 mg/dL — ABNORMAL LOW (ref >50)
LDL Cholesterol: 163 mg/dL — ABNORMAL HIGH (ref <100)
Triglycerides: 138 mg/dL (ref <150)
VLDL: 28 mg/dL (ref <30)

## 2016-07-06 LAB — POCT URINE PREGNANCY: PREG TEST UR: NEGATIVE

## 2016-07-06 MED ORDER — ASPIRIN EC 81 MG PO TBEC: 81.0000 mg | DELAYED_RELEASE_TABLET | Freq: Every day | ORAL | 2 refills | Status: DC

## 2016-07-06 MED ORDER — ATENOLOL 25 MG PO TABS: 50.0000 mg | ORAL_TABLET | Freq: Every day | ORAL | 1 refills | Status: DC

## 2016-07-06 MED ORDER — LISINOPRIL 20 MG PO TABS: 20.0000 mg | ORAL_TABLET | Freq: Every day | ORAL | 3 refills | Status: DC

## 2016-07-06 MED ORDER — CETIRIZINE HCL 10 MG PO TABS: 10.0000 mg | ORAL_TABLET | Freq: Every day | ORAL | 11 refills | Status: DC

## 2016-07-06 MED ORDER — HYDROCHLOROTHIAZIDE 25 MG PO TABS: 25.0000 mg | ORAL_TABLET | Freq: Every day | ORAL | 3 refills | Status: DC

## 2016-07-06 MED ORDER — LISINOPRIL 20 MG PO TABS
20.0000 mg | ORAL_TABLET | Freq: Every day | ORAL | 3 refills | Status: DC
Start: 2016-07-06 — End: 2016-07-06

## 2016-07-06 MED ORDER — BLOOD PRESSURE CUFF MISC
0 refills | Status: DC
Start: 2016-07-06 — End: 2017-05-25

## 2016-07-06 MED FILL — ?LISINOPRIL 20 MG TABLET: 20 | 30 days supply | Qty: 30 | Fill #0

## 2016-07-06 MED FILL — ?CETIRIZINE HCL 10 MG TABLE: 10 | 30 days supply | Qty: 30 | Fill #0

## 2016-07-06 MED FILL — HYDROCHLOROTHIAZIDE 25 MG T: 25 | 30 days supply | Qty: 30 | Fill #0

## 2016-07-06 MED FILL — ATENOLOL 25 MG TABLET: 25 | 15 days supply | Qty: 30 | Fill #0

## 2016-07-06 NOTE — Patient Instructions (Addendum)
To schedule your breast exam, please call [facility] at   Phone: [phone]   Return 1 week for blood pressure check.  Return in 4 weeks for hypertension follow-up.  I have refilled your atenolol, hydrochlorothiazide, and Lisinopril   Hypertension Hypertension, commonly called high blood pressure, is when the force of blood pumping through the arteries is too strong. The arteries are the blood vessels that carry blood from the heart throughout the body. Hypertension forces the heart to work harder to pump blood and may cause arteries to become narrow or stiff. Having untreated or uncontrolled hypertension can cause heart attacks, strokes, kidney disease, and other problems. A blood pressure reading consists of a higher number over a lower number. Ideally, your blood pressure should be below 120/80. The first ("top") number is called the systolic pressure. It is a measure of the pressure in your arteries as your heart beats. The second ("bottom") number is called the diastolic pressure. It is a measure of the pressure in your arteries as the heart relaxes. What are the causes? The cause of this condition is not known. What increases the risk? Some risk factors for high blood pressure are under your control. Others are not. Factors you can change   Smoking.  Having type 2 diabetes mellitus, high cholesterol, or both.  Not getting enough exercise or physical activity.  Being overweight.  Having too much fat, sugar, calories, or salt (sodium) in your diet.  Drinking too much alcohol. Factors that are difficult or impossible to change   Having chronic kidney disease.  Having a family history of high blood pressure.  Age. Risk increases with age.  Race. You may be at higher risk if you are African-American.  Gender. Men are at higher risk than women before age 60. After age 84, women are at higher risk than men.  Having obstructive sleep apnea.  Stress. What  are the signs or symptoms? Extremely high blood pressure (hypertensive crisis) may cause:  Headache.  Anxiety.  Shortness of breath.  Nosebleed.  Nausea and vomiting.  Severe chest pain.  Jerky movements you cannot control (seizures). How is this diagnosed? This condition is diagnosed by measuring your blood pressure while you are seated, with your arm resting on a surface. The cuff of the blood pressure monitor will be placed directly against the skin of your upper arm at the level of your heart. It should be measured at least twice using the same arm. Certain conditions can cause a difference in blood pressure between your right and left arms. Certain factors can cause blood pressure readings to be lower or higher than normal (elevated) for a short period of time:  When your blood pressure is higher when you are in a health care provider's office than when you are at home, this is called white coat hypertension. Most people with this condition do not need medicines.  When your blood pressure is higher at home than when you are in a health care provider's office, this is called masked hypertension. Most people with this condition may need medicines to control blood pressure. If you have a high blood pressure reading during one visit or you have normal blood pressure with other risk factors:  You may be asked to return on a different day to have your blood pressure checked again.  You may be asked to monitor your blood pressure at home for 1 week or longer. If you are diagnosed with hypertension, you may have  other blood or imaging tests to help your health care provider understand your overall risk for other conditions. How is this treated? This condition is treated by making healthy lifestyle changes, such as eating healthy foods, exercising more, and reducing your alcohol intake. Your health care provider may prescribe medicine if lifestyle changes are not enough to get your blood  pressure under control, and if:  Your systolic blood pressure is above 130.  Your diastolic blood pressure is above 80. Your personal target blood pressure may vary depending on your medical conditions, your age, and other factors. Follow these instructions at home: Eating and drinking   Eat a diet that is high in fiber and potassium, and low in sodium, added sugar, and fat. An example eating plan is called the DASH (Dietary Approaches to Stop Hypertension) diet. To eat this way:  Eat plenty of fresh fruits and vegetables. Try to fill half of your plate at each meal with fruits and vegetables.  Eat whole grains, such as whole wheat pasta, brown rice, or whole grain bread. Fill about one quarter of your plate with whole grains.  Eat or drink low-fat dairy products, such as skim milk or low-fat yogurt.  Avoid fatty cuts of meat, processed or cured meats, and poultry with skin. Fill about one quarter of your plate with lean proteins, such as fish, chicken without skin, beans, eggs, and tofu.  Avoid premade and processed foods. These tend to be higher in sodium, added sugar, and fat.  Reduce your daily sodium intake. Most people with hypertension should eat less than 1,500 mg of sodium a day.  Limit alcohol intake to no more than 1 drink a day for nonpregnant women and 2 drinks a day for men. One drink equals 12 oz of beer, 5 oz of wine, or 1 oz of hard liquor. Lifestyle   Work with your health care provider to maintain a healthy body weight or to lose weight. Ask what an ideal weight is for you.  Get at least 30 minutes of exercise that causes your heart to beat faster (aerobic exercise) most days of the week. Activities may include walking, swimming, or biking.  Include exercise to strengthen your muscles (resistance exercise), such as pilates or lifting weights, as part of your weekly exercise routine. Try to do these types of exercises for 30 minutes at least 3 days a week.  Do not  use any products that contain nicotine or tobacco, such as cigarettes and e-cigarettes. If you need help quitting, ask your health care provider.  Monitor your blood pressure at home as told by your health care provider.  Keep all follow-up visits as told by your health care provider. This is important. Medicines   Take over-the-counter and prescription medicines only as told by your health care provider. Follow directions carefully. Blood pressure medicines must be taken as prescribed.  Do not skip doses of blood pressure medicine. Doing this puts you at risk for problems and can make the medicine less effective.  Ask your health care provider about side effects or reactions to medicines that you should watch for. Contact a health care provider if:  You think you are having a reaction to a medicine you are taking.  You have headaches that keep coming back (recurring).  You feel dizzy.  You have swelling in your ankles.  You have trouble with your vision. Get help right away if:  You develop a severe headache or confusion.  You have unusual weakness  or numbness.  You feel faint.  You have severe pain in your chest or abdomen.  You vomit repeatedly.  You have trouble breathing. Summary  Hypertension is when the force of blood pumping through your arteries is too strong. If this condition is not controlled, it may put you at risk for serious complications.  Your personal target blood pressure may vary depending on your medical conditions, your age, and other factors. For most people, a normal blood pressure is less than 120/80.  Hypertension is treated with lifestyle changes, medicines, or a combination of both. Lifestyle changes include weight loss, eating a healthy, low-sodium diet, exercising more, and limiting alcohol. This information is not intended to replace advice given to you by your health care provider. Make sure you discuss any questions you have with your health  care provider. Document Released: 02/22/2005 Document Revised: 01/21/2016 Document Reviewed: 01/21/2016 Elsevier Interactive Patient Education  2017 [employer].

## 2016-07-06 NOTE — Progress Notes (Signed)
Patient ID: Jennifer Ayers, female    DOB: August 08, 1975, 41 y.o.   MRN: [id number]  PCP: Joaquin Courts, FNP  Chief Complaint  Patient presents with  . Establish Care  . Hypertension    Subjective:  HPI Jennifer Ayers is a 41 y.o. female presents to establish care and evaluation of hypertension.  Medical problems include: Hypertension, Obesity (BMI 34.7), and current smoker.  Jennifer Ayers was previously followed by another primary care provider for management of hypertension. She subsequently loss her healthcare coverage and therefore, she has been without antihypertensive medication since November 2017. She has had a few remaining doses of HCTZ and she has taken occasionally when her blood pressure was elevated.  Occasionally monitors blood pressure and recently readings have peaked as high as 200/110. Lowest readings she has obtained over the last month, 160's/90's.  She denies chest pain, shortness of breath, although reports occasional headaches which are present upon awakening and occur in the middle of the day. Headaches are frontal and occur on the top of her head. Jennifer Ayers reports a long history of palpitations for which she was taking atenolol. For blood pressure management her prior regimen included: lisinopril and hydrochlorothiazide.  Jennifer Ayers has significant family history for cardiovascular disease. Her father at age 37 passed away of a sudden MI and her brother passed away from a MI last year at age 26 and her mother suffers from hypertension. She reports no routine physical activity, doesn't adhere to low sodium diet, and continues to smoke.    Social History   Social History  . Marital status: Married    Spouse name: N/A  . Number of children: N/A  . Years of education: N/A   Occupational History  . Not on file.   Social History Main Topics  . Smoking status: Current Every Day Smoker    Years: 5.00    Types: Cigarettes  . Smokeless tobacco: Never Used  . Alcohol  use No  . Drug use: No  . Sexual activity: Yes    Birth control/ protection: None   Other Topics Concern  . Not on file   Social History Narrative  . No narrative on file   History reviewed. No pertinent family history.  Review of Systems See HPI   Prior to Admission medications   Medication Sig Start Date End Date Taking? Authorizing Provider  etonogestrel (NEXPLANON) 68 MG IMPL implant 1 each by Subdermal route once.   Yes Historical Provider, MD  lisinopril (PRINIVIL,ZESTRIL) 10 MG tablet Take 10 mg by mouth daily.   Yes Historical Provider, MD  loratadine (CLARITIN) 10 MG tablet Take 10 mg by mouth daily.   Yes Historical Provider, MD  amLODipine (NORVASC) 10 MG tablet Take 10 mg by mouth daily.    Historical Provider, MD    Past Medical, Surgical Family and Social History reviewed and updated.    Objective:   Today's Vitals   07/06/16 1339 07/06/16 1344  BP: (!) 188/111 (!) 183/101  Pulse: 84   Resp: 16   Temp: 98.3 F (36.8 C)   TempSrc: Oral   SpO2: 100%   Weight: 184 lb (83.5 kg)   Height:  (1.549 m)     Wt Readings from Last 3 Encounters:  07/06/16 184 lb (83.5 kg)  04/22/14 173 lb (78.5 kg)  12/01/13 182 lb 8 oz (82.8 kg)    Physical Exam  Constitutional: She is oriented to person, place, and time. She appears well-developed and well-nourished.  HENT:  Head: Normocephalic and atraumatic.  Right Ear: External ear normal.  Left Ear: External ear normal.  Nose: Nose normal.  Eyes: Conjunctivae are normal. Pupils are equal, round, and reactive to light.  Neck: Normal range of motion. Neck supple.  Cardiovascular: Normal rate, regular rhythm and normal heart sounds.   Pulses:      Carotid pulses are on the right side with bruit, and 2+ on the left side.      Radial pulses are 2+ on the right side, and 2+ on the left side.  Visible unilateral pulsation from the right carotid only  Pulmonary/Chest: Effort normal and breath sounds normal.   Musculoskeletal: Normal range of motion.  Neurological: She is alert and oriented to person, place, and time.  Skin: Skin is warm and dry.  Psychiatric: She has a normal mood and affect. Her behavior is normal. Judgment and thought content normal.     Assessment & Plan:  1. Essential hypertension -Hydrochlorothiazide 25 mg daily - Lisinopril 20 mg daily - Atenolol 25 mg (2 tablets, 50 mg) daily    2. Right carotid bruit,  Risk factors for stroke include: uncontrolled/accelerated hypertension (most recent highest pressures 200 systolic and low 161 diastolic), obesity, current smoker, medication non-compliance, sudden death related to cardiac disease of brother (22) ,father(57)  -physical exam revealed significant unilateral pulsation of right carotid artery with a audibly loud bruit noted on auscultation of right carotid artery.  -Bilateral Carotid Doppler pending   RTC:   Return 1 week for blood pressure check. Return in 4 weeks for hypertension follow-up.  Godfrey Pick. Tiburcio Pea, MSN, [facility] [address] [place], [place] [address] [phone]

## 2016-07-07 LAB — HEMOGLOBIN A1C
Hgb A1c MFr Bld: 5.1 % (ref <5.7)
MEAN PLASMA GLUCOSE: 100 mg/dL

## 2016-07-11 DIAGNOSIS — I10 Essential (primary) hypertension: Secondary | ICD-10-CM | POA: Insufficient documentation

## 2016-07-11 DIAGNOSIS — R0989 Other specified symptoms and signs involving the circulatory and respiratory systems: Secondary | ICD-10-CM | POA: Insufficient documentation

## 2016-07-12 ENCOUNTER — Telehealth: Payer: Self-pay

## 2016-07-12 NOTE — Telephone Encounter (Signed)
Patient scheduled for 07/13/2016 at 2pm.

## 2016-07-12 NOTE — Telephone Encounter (Signed)
-----   Message from Bing NeighborsKimberly S Harris, FNP sent at 07/11/2016  3:01 PM EDT ----- Regarding: Carotid Doppler [facility] or first available location.

## 2016-07-12 NOTE — Telephone Encounter (Signed)
Patient notified of appointment for tomorrow

## 2016-07-12 NOTE — Addendum Note (Signed)
Addended by: Bing NeighborsHARRIS, Davanta Meuser S on: 07/12/2016 09:37 AM   Modules accepted: Orders

## 2016-07-12 NOTE — Telephone Encounter (Signed)
-----   Message from Kimberly S Harris, FNP sent at 07/11/2016  3:01 PM EDT ----- Regarding: Carotid Doppler Please schedule carotid doppler here at [facility] or first available location. 

## 2016-07-13 ENCOUNTER — Ambulatory Visit (HOSPITAL_COMMUNITY)
Admission: RE | Admit: 2016-07-13 | Discharge: 2016-07-13 | Disposition: A | Discharge status: Home / Self Care | Payer: Medicaid Other | Source: Ambulatory Visit | Attending: Family Medicine | Admitting: Family Medicine

## 2016-07-13 ENCOUNTER — Ambulatory Visit (INDEPENDENT_AMBULATORY_CARE_PROVIDER_SITE_OTHER): Payer: Self-pay | Admitting: Family Medicine

## 2016-07-13 ENCOUNTER — Ambulatory Visit: Payer: Medicaid Other | Admitting: Family Medicine

## 2016-07-13 VITALS — BP 158/90

## 2016-07-13 VITALS — BP 160/91

## 2016-07-13 DIAGNOSIS — Z0130 Encounter for examination of blood pressure without abnormal findings: Secondary | ICD-10-CM

## 2016-07-13 DIAGNOSIS — R0989 Other specified symptoms and signs involving the circulatory and respiratory systems: Secondary | ICD-10-CM | POA: Insufficient documentation

## 2016-07-13 DIAGNOSIS — I10 Essential (primary) hypertension: Secondary | ICD-10-CM

## 2016-07-13 LAB — VAS US CAROTID
LCCADDIAS: -18 cm/s
LCCAPSYS: 75 cm/s
LEFT VERTEBRAL DIAS: -9 cm/s
Left CCA dist sys: -60 cm/s
Left CCA prox dias: 19 cm/s
Left ICA dist dias: -34 cm/s
Left ICA dist sys: -102 cm/s
Left ICA prox dias: -27 cm/s
Left ICA prox sys: -64 cm/s
RCCADSYS: -72 cm/s
RIGHT ECA DIAS: -11 cm/s
RIGHT VERTEBRAL DIAS: -15 cm/s
Right CCA prox dias: 10 cm/s
Right CCA prox sys: 76 cm/s

## 2016-07-13 MED ORDER — CLONIDINE HCL 0.1 MG/24HR TD PTWK: 0.1000 mg | MEDICATED_PATCH | TRANSDERMAL | 3 refills | Status: DC

## 2016-07-13 NOTE — Progress Notes (Signed)
Patient ID: Jennifer Ayers, female    DOB: [birth year], 41 y.o.   MRN: [id number]  PCP: Bing Neighbors, FNP  Chief Complaint  Patient presents with  . Hypertension     Subjective:  HPI Jennifer Ayers is a 41 y.o. female presents for medication management and blood pressure follow-up. Jennifer Ayers was last seen in clinic on 07/06/2016 and was placed back of her hypertension medications. During her last visit, on exam she was noted to have a right carotid bruit for which she is obtaining a carotid doppler today. Today she reports that she hasn't been able to obtain a blood pressure cuff and therefore she is uncertain of blood Pressure readings since her last office visit. Today she hasn't taken medication as she took all blood pressure medication  at one time and she experienced nausea and vomiting. Requesting some instruction as to how to take her medication to  prevent this type of reaction going forward. Blood pressure today was elevated during a nurses visit earlier this morning  160/91.   Social History   Social History  . Marital status: Single    Spouse name: N/A  . Number of children: N/A  . Years of education: N/A   Occupational History  . Not on file.   Social History Main Topics  . Smoking status: Current Every Day Smoker    Years: 5.00    Types: Cigarettes  . Smokeless tobacco: Never Used  . Alcohol use No  . Drug use: No  . Sexual activity: Yes    Birth control/ protection: None   Other Topics Concern  . Not on file   Social History Narrative  . No narrative on file   Review of Systems See HPI  Patient Active Problem List   Diagnosis Date Noted  . HTN (hypertension) 07/11/2016  . Right carotid bruit 07/11/2016    Allergies  Allergen Reactions  . Amlodipine     Really severe headaches     Prior to Admission medications   Medication Sig Start Date End Date Taking? Authorizing Provider  aspirin EC 81 MG tablet Take 1 tablet (81 mg total) by  mouth daily. 07/06/16   Bing Neighbors, FNP  atenolol (TENORMIN) 25 MG tablet Take 2 tablets (50 mg total) by mouth daily. 07/06/16   Bing Neighbors, FNP  Blood Pressure Monitoring (BLOOD PRESSURE CUFF) MISC Take blood pressure once daily and notify your provider if blood pressure is greater than 160/90 07/06/16   Bing Neighbors, FNP  cetirizine (ZYRTEC) 10 MG tablet Take 1 tablet (10 mg total) by mouth daily. 07/06/16   Bing Neighbors, FNP  etonogestrel (NEXPLANON) 68 MG IMPL implant 1 each by Subdermal route once.    [provider]  hydrochlorothiazide (HYDRODIURIL) 25 MG tablet Take 1 tablet (25 mg total) by mouth daily. 07/06/16   Bing Neighbors, FNP  lisinopril (PRINIVIL,ZESTRIL) 20 MG tablet Take 1 tablet (20 mg total) by mouth daily. 07/06/16   Bing Neighbors, FNP  loratadine (CLARITIN) 10 MG tablet Take 10 mg by mouth daily.    [provider]    Past Medical, Surgical Family and Social History reviewed and updated.    Objective:   Today's Vitals   07/13/16 1517  BP: (!) 158/90     Wt Readings from Last 3 Encounters:  07/06/16 184 lb (83.5 kg)  04/22/14 173 lb (78.5 kg)  12/01/13 182 lb 8 oz (82.8 kg)    Physical Exam  Constitutional: She is oriented to person, place, and time. She appears well-developed and well-nourished.  HENT:  Head: Normocephalic and atraumatic.  Eyes: Conjunctivae are normal. Pupils are equal, round, and reactive to light.  Neck: Normal range of motion. Neck supple.  Cardiovascular: Normal rate, regular rhythm, normal heart sounds and intact distal pulses.   Pulmonary/Chest: Effort normal and breath sounds normal.  Musculoskeletal: Normal range of motion.  Neurological: She is alert and oriented to person, place, and time.  Skin: Skin is warm and dry.  Psychiatric: She has a normal mood and affect. Her behavior is normal. Judgment and thought content normal.      Assessment & Plan:  1. Essential  hypertension -Continue Lisinopril 20 mg daily, hydrochlorothiazide 25 mg daily, atenolol 50 mg daily  -Added Clonidine patch 0.1 transdermal patches once weekly  -Obtain blood pressure cuff and keep a log of readings  2. Right carotid bruit -Carotid Doppler Pending   RTC: 1 month for a hypertension follow-up  Godfrey PickKimberly S. Tiburcio PeaHarris, MSN, [facility] [address] [place], [place] [id number] [phone]

## 2016-07-13 NOTE — Progress Notes (Signed)
VASCULAR LAB PRELIMINARY  PRELIMINARY  PRELIMINARY  PRELIMINARY  Carotid duplex completed.    Preliminary report:  Bilateral- No evidence of significant extracranial ICA stenosis. Vertebral artery flow is antegrade  Kaidence Sant, RVS 07/13/2016, 2:41 PM

## 2016-07-14 ENCOUNTER — Telehealth: Payer: Self-pay | Admitting: Family Medicine

## 2016-07-14 MED FILL — cloNIDine 0.1 MG/24HR PTWK: 0.1 | 30 days supply | Qty: 4 | Fill #0

## 2016-07-14 NOTE — Telephone Encounter (Signed)
Called with results of carotid doppler. Advise negative for any acute findings however there is evidence of mild plaque formation and tortuous vascular. Plan at present is to control tight control of blood pressure, and increase physical activity. Provided warning signs of CVA and when to go to the ED. Once patient obtains financial assistance will  refer to cardiology for evaluation due to positive family hx of MI. If blood pressure remains, uncontrolled at next visit, will refer to cardiology.

## 2016-07-18 ENCOUNTER — Encounter: Payer: Self-pay | Admitting: Family Medicine

## 2016-07-19 ENCOUNTER — Other Ambulatory Visit: Payer: Self-pay | Admitting: Family Medicine

## 2016-07-19 MED ORDER — ATORVASTATIN CALCIUM 20 MG PO TABS: 20.0000 mg | ORAL_TABLET | Freq: Every day | ORAL | 3 refills | Status: DC

## 2016-07-19 NOTE — Progress Notes (Signed)
E-prescribed Lipitor 20 mg

## 2016-07-31 ENCOUNTER — Other Ambulatory Visit: Payer: Self-pay | Admitting: Family Medicine

## 2016-07-31 DIAGNOSIS — I10 Essential (primary) hypertension: Secondary | ICD-10-CM

## 2016-07-31 DIAGNOSIS — R9431 Abnormal electrocardiogram [ECG] [EKG]: Secondary | ICD-10-CM

## 2016-07-31 NOTE — Progress Notes (Signed)
Cardiology referral- uncontrolled hypertension

## 2016-08-03 ENCOUNTER — Telehealth: Payer: Self-pay

## 2016-08-03 NOTE — Telephone Encounter (Signed)
-----   Message from Bing NeighborsKimberly S Harris, FNP sent at 07/30/2016 12:05 AM EDT ----- Regarding: FW: Check BP  Call patient to follow-up on blood pressure readings 08/03/2016.  ----- Message ----- From: Bing NeighborsHarris, Kimberly S, FNP Sent: 07/14/2016   3:50 PM To: Bing NeighborsKimberly S Harris, FNP Subject: Check BP                                       BP phone call

## 2016-08-03 NOTE — Telephone Encounter (Signed)
Spoke with patient and she will give us a callback with the readngs.

## 2016-08-05 MED FILL — ATORVASTATIN 20 MG TABLET: 20 | 90 days supply | Qty: 90 | Fill #0

## 2016-08-05 MED FILL — ATENOLOL 25 MG TABLET: 25 | 15 days supply | Qty: 30 | Fill #1

## 2016-08-06 MED FILL — ?LISINOPRIL 20 MG TABLET: 20 | 30 days supply | Qty: 30 | Fill #1

## 2016-08-06 MED FILL — ?CETIRIZINE HCL 10 MG TABLE: 10 | 30 days supply | Qty: 30 | Fill #1

## 2016-08-06 MED FILL — cloNIDine 0.1 MG/24HR PTWK: 0.1 | 30 days supply | Qty: 4 | Fill #1

## 2016-08-10 ENCOUNTER — Encounter: Payer: Self-pay | Admitting: Family Medicine

## 2016-08-10 ENCOUNTER — Ambulatory Visit (INDEPENDENT_AMBULATORY_CARE_PROVIDER_SITE_OTHER): Payer: Self-pay | Admitting: Family Medicine

## 2016-08-10 VITALS — BP 150/98 | HR 70 | Temp 98.1°F | Resp 16 | Ht 61.0 in | Wt 180.4 lb

## 2016-08-10 DIAGNOSIS — I10 Essential (primary) hypertension: Secondary | ICD-10-CM

## 2016-08-10 DIAGNOSIS — R300 Dysuria: Secondary | ICD-10-CM

## 2016-08-10 LAB — POCT URINALYSIS DIP (DEVICE)
Bilirubin Urine: NEGATIVE
GLUCOSE, UA: NEGATIVE mg/dL
KETONES UR: NEGATIVE mg/dL
Leukocytes, UA: NEGATIVE
Nitrite: NEGATIVE
PROTEIN: 100 mg/dL — AB
Specific Gravity, Urine: >=1.03 (ref 1.005–1.030)
UROBILINOGEN UA: 1 mg/dL (ref 0.0–1.0)
pH: 5.5 (ref 5.0–8.0)

## 2016-08-10 NOTE — Progress Notes (Signed)
Patient ID: Jennifer Ayers, female    DOB: 01/28/1976, 41 y.o.   MRN: [id number]  PCP: Bing Neighbors, FNP  Chief Complaint  Patient presents with  . Follow-up    Blood pressure    Subjective:  HPI  Jennifer Ayers is a 41 y.o. female presents for blood pressure follow-up. Active problem list and medication list reviewed. During last office visit on 07/13/2016, Jennifer Ayers blood pressure remained significantly elevated on Hydrochlorothiazide, Lisinopril, and Atenolol. She was started on Clonidine 0.1 mg weekly patch. She continues to make weight loss effort.Trying to loose to weight.  Reducing intake of sugary beverages.  Her next goal is to start an exercise routine. Reports a 4 days history of dysuria with associated lower back pain. Abdominal discomforting and nausea. Denies any changes in urine odor or color. She is trying to incorporate more water in urine.  Social History   Social History  . Marital status: Single    Spouse name: N/A  . Number of children: N/A  . Years of education: N/A   Occupational History  . Not on file.   Social History Main Topics  . Smoking status: Current Every Day Smoker    Years: 5.00    Types: Cigarettes  . Smokeless tobacco: Never Used  . Alcohol use No  . Drug use: No  . Sexual activity: Yes    Birth control/ protection: None   Other Topics Concern  . Not on file   Social History Narrative  . No narrative on file   Review of Systems  See HPI  Patient Active Problem List   Diagnosis Date Noted  . HTN (hypertension) 07/11/2016  . Right carotid bruit 07/11/2016    Allergies  Allergen Reactions  . Amlodipine     Really severe headaches     Prior to Admission medications   Medication Sig Start Date End Date Taking? Authorizing Provider  aspirin EC 81 MG tablet Take 1 tablet (81 mg total) by mouth daily. 07/06/16  Yes Bing Neighbors, FNP  atenolol (TENORMIN) 25 MG tablet Take 2 tablets (50 mg total) by mouth  daily. 07/06/16  Yes Bing Neighbors, FNP  atorvastatin (LIPITOR) 20 MG tablet Take 1 tablet (20 mg total) by mouth daily. 07/19/16  Yes Bing Neighbors, FNP  Blood Pressure Monitoring (BLOOD PRESSURE CUFF) MISC Take blood pressure once daily and notify your provider if blood pressure is greater than 160/90 07/06/16  Yes Bing Neighbors, FNP  cetirizine (ZYRTEC) 10 MG tablet Take 1 tablet (10 mg total) by mouth daily. 07/06/16  Yes Bing Neighbors, FNP  cloNIDine (CATAPRES - DOSED IN MG/24 HR) 0.1 mg/24hr patch Place 1 patch (0.1 mg total) onto the skin once a week. 07/13/16  Yes Bing Neighbors, FNP  etonogestrel (NEXPLANON) 68 MG IMPL implant 1 each by Subdermal route once.   Yes [provider]  hydrochlorothiazide (HYDRODIURIL) 25 MG tablet Take 1 tablet (25 mg total) by mouth daily. 07/06/16  Yes Bing Neighbors, FNP  lisinopril (PRINIVIL,ZESTRIL) 20 MG tablet Take 1 tablet (20 mg total) by mouth daily. 07/06/16  Yes Bing Neighbors, FNP  loratadine (CLARITIN) 10 MG tablet Take 10 mg by mouth daily.   Yes [provider]    Past Medical, Surgical Family and Social History reviewed and updated.    Objective:   Today's Vitals   08/10/16 1329 08/10/16 1330  BP: (!) 166/90 (!) 150/98  Pulse: 70   Resp: 16  Temp: 98.1 F (36.7 C)   TempSrc: Oral   SpO2: 100%   Weight: 180 lb 6.4 oz (81.8 kg)   Height: 5\' 1"  (1.549 m)     Wt Readings from Last 3 Encounters:  08/10/16 180 lb 6.4 oz (81.8 kg)  07/06/16 184 lb (83.5 kg)  04/22/14 173 lb (78.5 kg)    Physical Exam  Constitutional: She is oriented to person, place, and time. She appears well-developed and well-nourished.  HENT:  Head: Normocephalic and atraumatic.  Eyes: Conjunctivae and EOM are normal. Pupils are equal, round, and reactive to light.  Neck: Carotid bruit is present.  Right chronic bruit   Cardiovascular: Normal rate, regular rhythm, normal heart sounds and intact distal pulses.    Pulmonary/Chest: Effort normal and breath sounds normal.  Neurological: She is alert and oriented to person, place, and time. She displays normal reflexes. No cranial nerve deficit. Coordination normal.  Skin: Skin is warm and dry.  Psychiatric: She has a normal mood and affect. Her behavior is normal. Judgment and thought content normal.    Assessment & Plan:  1. Essential hypertension, improved from prior readings, continues repeat reading is 150/98. -Continue all medications as prescribed -Goal at follow-up to loose 10 lbs. -Continue to monitor BP at home and notify office of persistent blood pressure readings greater than 150/90   2. Dysuria -Hemoglobin present in urine which appears to be chronic., although UA negative for infection.  No CVA tenderness on exam or flank pain. Urine culture pending, if negative consider KUB to rule out renal stone.  -You will be notified of urine culture results   RTC: 3 months of sooner if needed.   Godfrey PickKimberly S. Tiburcio PeaHarris, MSN, FNP-C [facility]  [address] [place], [place] [id number] [phone]

## 2016-08-10 NOTE — Patient Instructions (Signed)
No changes in medication therapy. You urine is negative for infection, however indicates that you are dehydrated. Increase water intake to 6-8, 8 oz glasses of water daily.  Goal for weight loss is 10 lbs at next follow-up.    Exercising to Stay Healthy Exercising regularly is important. It has many health benefits, such as:  Improving your overall fitness, flexibility, and endurance.  Increasing your bone density.  Helping with weight control.  Decreasing your body fat.  Increasing your muscle strength.  Reducing stress and tension.  Improving your overall health.  In order to become healthy and stay healthy, it is recommended that you do moderate-intensity and vigorous-intensity exercise. You can tell that you are exercising at a moderate intensity if you have a higher heart rate and faster breathing, but you are still able to hold a conversation. You can tell that you are exercising at a vigorous intensity if you are breathing much harder and faster and cannot hold a conversation while exercising. How often should I exercise? Choose an activity that you enjoy and set realistic goals. Your health care provider can help you to make an activity plan that works for you. Exercise regularly as directed by your health care provider. This may include:  Doing resistance training twice each week, such as: ? Push-ups. ? Sit-ups. ? Lifting weights. ? Using resistance bands.  Doing a given intensity of exercise for a given amount of time. Choose from these options: ? 150 minutes of moderate-intensity exercise every week. ? 75 minutes of vigorous-intensity exercise every week. ? A mix of moderate-intensity and vigorous-intensity exercise every week.  Children, pregnant women, people who are out of shape, people who are overweight, and older adults may need to consult a health care provider for individual recommendations. If you have any sort of medical condition, be sure to consult your  health care provider before starting a new exercise program. What are some exercise ideas? Some moderate-intensity exercise ideas include:  Walking at a rate of 1 mile in 15 minutes.  Biking.  Hiking.  Golfing.  Dancing.  Some vigorous-intensity exercise ideas include:  Walking at a rate of at least 4.5 miles per hour.  Jogging or running at a rate of 5 miles per hour.  Biking at a rate of at least 10 miles per hour.  Lap swimming.  Roller-skating or in-line skating.  Cross-country skiing.  Vigorous competitive sports, such as football, basketball, and soccer.  Jumping rope.  Aerobic dancing.  What are some everyday activities that can help me to get exercise?  Yard work, such as: ? Pushing a Surveyor, mining. ? Raking and bagging leaves.  Washing and waxing your car.  Pushing a stroller.  Shoveling snow.  Gardening.  Washing windows or floors. How can I be more active in my day-to-day activities?  Use the stairs instead of the elevator.  Take a walk during your lunch break.  If you drive, park your car farther away from work or school.  If you take public transportation, get off one stop early and walk the rest of the way.  Make all of your phone calls while standing up and walking around.  Get up, stretch, and walk around every 30 minutes throughout the day. What guidelines should I follow while exercising?  Do not exercise so much that you hurt yourself, feel dizzy, or get very short of breath.  Consult your health care provider before starting a new exercise program.  Wear comfortable clothes and shoes with  good support.  Drink plenty of water while you exercise to prevent dehydration or heat stroke. Body water is lost during exercise and must be replaced.  Work out until you breathe faster and your heart beats faster. This information is not intended to replace advice given to you by your health care provider. Make sure you discuss any questions  you have with your health care provider. Document Released: 03/27/2010 Document Revised: 07/31/2015 Document Reviewed: 07/26/2013 Elsevier Interactive Patient Education  2018 [employer].  DASH Eating Plan DASH stands for "Dietary Approaches to Stop Hypertension." The DASH eating plan is a healthy eating plan that has been shown to reduce high blood pressure (hypertension). It may also reduce your risk for type 2 diabetes, heart disease, and stroke. The DASH eating plan may also help with weight loss. What are tips for following this plan? General guidelines  Avoid eating more than 2,300 mg (milligrams) of salt (sodium) a day. If you have hypertension, you may need to reduce your sodium intake to 1,500 mg a day.  Limit alcohol intake to no more than 1 drink a day for nonpregnant women and 2 drinks a day for men. One drink equals 12 oz of beer, 5 oz of wine, or 1 oz of hard liquor.  Work with your health care provider to maintain a healthy body weight or to lose weight. Ask what an ideal weight is for you.  Get at least 30 minutes of exercise that causes your heart to beat faster (aerobic exercise) most days of the week. Activities may include walking, swimming, or biking.  Work with your health care provider or diet and nutrition specialist (dietitian) to adjust your eating plan to your individual calorie needs. Reading food labels  Check food labels for the amount of sodium per serving. Choose foods with less than 5 percent of the Daily Value of sodium. Generally, foods with less than 300 mg of sodium per serving fit into this eating plan.  To find whole grains, look for the word "whole" as the first word in the ingredient list. Shopping  Buy products labeled as "low-sodium" or "no salt added."  Buy fresh foods. Avoid canned foods and premade or frozen meals. Cooking  Avoid adding salt when cooking. Use salt-free seasonings or herbs instead of table salt or sea salt. Check with your  health care provider or pharmacist before using salt substitutes.  Do not fry foods. Cook foods using healthy methods such as baking, boiling, grilling, and broiling instead.  Cook with heart-healthy oils, such as olive, canola, soybean, or sunflower oil. Meal planning   Eat a balanced diet that includes: ? 5 or more servings of fruits and vegetables each day. At each meal, try to fill half of your plate with fruits and vegetables. ? Up to 6-8 servings of whole grains each day. ? Less than 6 oz of lean meat, poultry, or fish each day. A 3-oz serving of meat is about the same size as a deck of cards. One egg equals 1 oz. ? 2 servings of low-fat dairy each day. ? A serving of nuts, seeds, or beans 5 times each week. ? Heart-healthy fats. Healthy fats called Omega-3 fatty acids are found in foods such as flaxseeds and coldwater fish, like sardines, salmon, and mackerel.  Limit how much you eat of the following: ? Canned or prepackaged foods. ? Food that is high in trans fat, such as fried foods. ? Food that is high in saturated fat, such as  fatty meat. ? Sweets, desserts, sugary drinks, and other foods with added sugar. ? Full-fat dairy products.  Do not salt foods before eating.  Try to eat at least 2 vegetarian meals each week.  Eat more home-cooked food and less restaurant, buffet, and fast food.  When eating at a restaurant, ask that your food be prepared with less salt or no salt, if possible. What foods are recommended? The items listed may not be a complete list. Talk with your dietitian about what dietary choices are best for you. Grains Whole-grain or whole-wheat bread. Whole-grain or whole-wheat pasta. Brown rice. Orpah Cobb. Bulgur. Whole-grain and low-sodium cereals. Pita bread. Low-fat, low-sodium crackers. Whole-wheat flour tortillas. Vegetables Fresh or frozen vegetables (raw, steamed, roasted, or grilled). Low-sodium or reduced-sodium tomato and vegetable juice.  Low-sodium or reduced-sodium tomato sauce and tomato paste. Low-sodium or reduced-sodium canned vegetables. Fruits All fresh, dried, or frozen fruit. Canned fruit in natural juice (without added sugar). Meat and other protein foods Skinless chicken or [place]. Ground chicken or [place]. Pork with fat trimmed off. Fish and seafood. Egg whites. Dried beans, peas, or lentils. Unsalted nuts, nut butters, and seeds. Unsalted canned beans. Lean cuts of beef with fat trimmed off. Low-sodium, lean deli meat. Dairy Low-fat (1%) or fat-free (skim) milk. Fat-free, low-fat, or reduced-fat cheeses. Nonfat, low-sodium ricotta or cottage cheese. Low-fat or nonfat yogurt. Low-fat, low-sodium cheese. Fats and oils Soft margarine without trans fats. Vegetable oil. Low-fat, reduced-fat, or light mayonnaise and salad dressings (reduced-sodium). Canola, safflower, olive, soybean, and sunflower oils. Avocado. Seasoning and other foods Herbs. Spices. Seasoning mixes without salt. Unsalted popcorn and pretzels. Fat-free sweets. What foods are not recommended? The items listed may not be a complete list. Talk with your dietitian about what dietary choices are best for you. Grains Baked goods made with fat, such as croissants, muffins, or some breads. Dry pasta or rice meal packs. Vegetables Creamed or fried vegetables. Vegetables in a cheese sauce. Regular canned vegetables (not low-sodium or reduced-sodium). Regular canned tomato sauce and paste (not low-sodium or reduced-sodium). Regular tomato and vegetable juice (not low-sodium or reduced-sodium). Rosita Fire. Olives. Fruits Canned fruit in a light or heavy syrup. Fried fruit. Fruit in cream or butter sauce. Meat and other protein foods Fatty cuts of meat. Ribs. Fried meat. Tomasa Blase. Sausage. Bologna and other processed lunch meats. Salami. Fatback. Hotdogs. Bratwurst. Salted nuts and seeds. Canned beans with added salt. Canned or smoked fish. Whole eggs or egg yolks. Chicken  or [place] with skin. Dairy Whole or 2% milk, cream, and half-and-half. Whole or full-fat cream cheese. Whole-fat or sweetened yogurt. Full-fat cheese. Nondairy creamers. Whipped toppings. Processed cheese and cheese spreads. Fats and oils Butter. Stick margarine. Lard. Shortening. Ghee. Bacon fat. Tropical oils, such as coconut, palm kernel, or palm oil. Seasoning and other foods Salted popcorn and pretzels. Onion salt, garlic salt, seasoned salt, table salt, and sea salt. Worcestershire sauce. Tartar sauce. Barbecue sauce. Teriyaki sauce. Soy sauce, including reduced-sodium. Steak sauce. Canned and packaged gravies. Fish sauce. Oyster sauce. Cocktail sauce. Horseradish that you find on the shelf. Ketchup. Mustard. Meat flavorings and tenderizers. Bouillon cubes. Hot sauce and Tabasco sauce. Premade or packaged marinades. Premade or packaged taco seasonings. Relishes. Regular salad dressings. Where to find more information:  National Heart, Lung, and Blood Institute: PopSteam.is  American Heart Association: www.heart.org Summary  The DASH eating plan is a healthy eating plan that has been shown to reduce high blood pressure (hypertension). It may also reduce your risk for  type 2 diabetes, heart disease, and stroke.  With the DASH eating plan, you should limit salt (sodium) intake to 2,300 mg a day. If you have hypertension, you may need to reduce your sodium intake to 1,500 mg a day.  When on the DASH eating plan, aim to eat more fresh fruits and vegetables, whole grains, lean proteins, low-fat dairy, and heart-healthy fats.  Work with your health care provider or diet and nutrition specialist (dietitian) to adjust your eating plan to your individual calorie needs. This information is not intended to replace advice given to you by your health care provider. Make sure you discuss any questions you have with your health care provider. Document Released: 02/11/2011 Document Revised:  02/16/2016 Document Reviewed: 02/16/2016 Elsevier Interactive Patient Education  2017 [employer]

## 2016-08-11 LAB — URINE CULTURE: ORGANISM ID, BACTERIA: NO GROWTH

## 2016-08-12 ENCOUNTER — Ambulatory Visit: Payer: Medicaid Other | Attending: Internal Medicine

## 2016-09-27 MED FILL — ?CETIRIZINE HCL 10 MG TABLE: 10 | 30 days supply | Qty: 30 | Fill #2

## 2016-09-27 MED FILL — LISINOPRIL 20 MG TAB: 20 | 30 days supply | Qty: 30 | Fill #2

## 2016-09-30 ENCOUNTER — Other Ambulatory Visit: Payer: Self-pay | Admitting: Family Medicine

## 2016-09-30 MED FILL — ATENOLOL 25 MG TABLET: 25 | 15 days supply | Qty: 30 | Fill #0

## 2016-09-30 MED FILL — cloNIDine 0.1 MG/24HR PTWK: 0.1 | 30 days supply | Qty: 4 | Fill #2

## 2016-10-05 ENCOUNTER — Telehealth: Payer: Self-pay | Admitting: Family Medicine

## 2016-10-05 NOTE — Telephone Encounter (Signed)
Pt came to the office to find out if her application for CAFA and OC has been approval or not please call her back

## 2016-10-19 NOTE — Telephone Encounter (Signed)
Pt was approved for 50% CAFA.  I mailed an approval letter to the patient today and scanned it into the patient's documents.

## 2016-11-11 ENCOUNTER — Ambulatory Visit: Payer: Self-pay | Admitting: Family Medicine

## 2016-11-18 ENCOUNTER — Ambulatory Visit (INDEPENDENT_AMBULATORY_CARE_PROVIDER_SITE_OTHER): Payer: Self-pay | Admitting: Family Medicine

## 2016-11-18 ENCOUNTER — Encounter: Payer: Self-pay | Admitting: Family Medicine

## 2016-11-18 VITALS — BP 160/100 | HR 72 | Temp 98.5°F | Resp 14 | Ht 61.0 in | Wt 189.0 lb

## 2016-11-18 DIAGNOSIS — R12 Heartburn: Secondary | ICD-10-CM

## 2016-11-18 DIAGNOSIS — R0789 Other chest pain: Secondary | ICD-10-CM

## 2016-11-18 DIAGNOSIS — I10 Essential (primary) hypertension: Secondary | ICD-10-CM

## 2016-11-18 LAB — COMPLETE METABOLIC PANEL WITH GFR
AG Ratio: 1.5 (calc) (ref 1.0–2.5)
ALKALINE PHOSPHATASE (APISO): 69 U/L (ref 33–115)
ALT: 10 U/L (ref 6–29)
AST: 11 U/L (ref 10–30)
Albumin: 4.1 g/dL (ref 3.6–5.1)
BUN / CREAT RATIO: 11 (calc) (ref 6–22)
BUN: 14 mg/dL (ref 7–25)
CHLORIDE: 104 mmol/L (ref 98–110)
CO2: 26 mmol/L (ref 20–32)
CREATININE: 1.29 mg/dL — AB (ref 0.50–1.10)
Calcium: 9.6 mg/dL (ref 8.6–10.2)
GFR, Est African American: 60 mL/min/{1.73_m2} (ref >=60)
GFR, Est Non African American: 52 mL/min/{1.73_m2} — ABNORMAL LOW (ref >=60)
GLUCOSE: 90 mg/dL (ref 65–99)
Globulin: 2.7 g/dL (calc) (ref 1.9–3.7)
Potassium: 4.1 mmol/L (ref 3.5–5.3)
Sodium: 139 mmol/L (ref 135–146)
TOTAL PROTEIN: 6.8 g/dL (ref 6.1–8.1)
Total Bilirubin: 0.5 mg/dL (ref 0.2–1.2)

## 2016-11-18 LAB — POCT URINALYSIS DIP (DEVICE)
BILIRUBIN URINE: NEGATIVE
GLUCOSE, UA: NEGATIVE mg/dL
KETONES UR: NEGATIVE mg/dL
Leukocytes, UA: NEGATIVE
Nitrite: NEGATIVE
PROTEIN: NEGATIVE mg/dL
SPECIFIC GRAVITY, URINE: 1.02 (ref 1.005–1.030)
Urobilinogen, UA: 1 mg/dL (ref 0.0–1.0)
pH: 6.5 (ref 5.0–8.0)

## 2016-11-18 MED ORDER — CLONIDINE HCL 0.1 MG PO TABS
0.1000 mg | ORAL_TABLET | Freq: Once | ORAL | Status: AC
  Administered 2016-11-18: 0.1 mg via ORAL

## 2016-11-18 MED ORDER — ACETAMINOPHEN 500 MG PO TABS: 500.0000 mg | ORAL_TABLET | Freq: Four times a day (QID) | ORAL | 0 refills | Status: AC | PRN

## 2016-11-18 NOTE — Patient Instructions (Addendum)
Please return on Tuesday, September 18th for a blood pressure check. Please take all anti-hypertensive medications prior to arrival.   Will start a trial of Tylenol for chest wall pain. Take 500 mg every 6 hours for mild to moderate pain. Refrain from taking NSAIDs or other nephrotoxic medications a discussed.   Discussed symptoms of GERD at length. Will not start medication at this time. Discussed diet modifications and given written information.  Schedule follow up with Joaquin CourtsKimberly Harris, FNP as scheduled.   Patient advised to sit up for 1 hour after meals Refrain from wearing clothing that constricts waist Avoid foods that trigger heartburn.  Heartburn typically does not spread to shoulders, neck or arms. IGiven written information on food that typically trigger heartburn Also, advised to refrain from taking NSAIDs     Food Choices for Gastroesophageal Reflux Disease, Adult When you have gastroesophageal reflux disease (GERD), the foods you eat and your eating habits are very important. Choosing the right foods can help ease your discomfort. What guidelines do I need to follow?  Choose fruits, vegetables, whole grains, and low-fat dairy products.  Choose low-fat meat, fish, and poultry.  Limit fats such as oils, salad dressings, butter, nuts, and avocado.  Keep a food diary. This helps you identify foods that cause symptoms.  Avoid foods that cause symptoms. These may be different for everyone.  Eat small meals often instead of 3 large meals a day.  Eat your meals slowly, in a place where you are relaxed.  Limit fried foods.  Cook foods using methods other than frying.  Avoid drinking alcohol.  Avoid drinking large amounts of liquids with your meals.  Avoid bending over or lying down until 2-3 hours after eating. What foods are not recommended? These are some foods and drinks that may make your symptoms worse: Vegetables Tomatoes. Tomato juice. Tomato and spaghetti  sauce. Chili peppers. Onion and garlic. Horseradish. Fruits Oranges, grapefruit, and lemon (fruit and juice). Meats High-fat meats, fish, and poultry. This includes hot dogs, ribs, ham, sausage, salami, and bacon. Dairy Whole milk and chocolate milk. Sour cream. Cream. Butter. Ice cream. Cream cheese. Drinks Coffee and tea. Bubbly (carbonated) drinks or energy drinks. Condiments Hot sauce. Barbecue sauce. Sweets/Desserts Chocolate and cocoa. Donuts. Peppermint and spearmint. Fats and Oils High-fat foods. This includes [place] fries and potato chips. Other Vinegar. Strong spices. This includes black pepper, white pepper, red pepper, cayenne, curry powder, cloves, ginger, and chili powder. The items listed above may not be a complete list of foods and drinks to avoid. Contact your dietitian for more information. This information is not intended to replace advice given to you by your health care provider. Make sure you discuss any questions you have with your health care provider. Document Released: 08/24/2011 Document Revised: 07/31/2015 Document Reviewed: 12/27/2012 Elsevier Interactive Patient Education  2017 [employer]

## 2016-11-18 NOTE — Progress Notes (Signed)
Subjective:    Jennifer Ayers, a 41 year old female with a history of accelerated hypertension and right carotid bruit. Patient maintains that she has been taking medication consistently. [name] came directly from work and did not take prescribed antihypertensive drugs prior to arrival.  She did not take anti-hypertensive medications prior to arrival.  Blood pressure is markedly elevated at present. Patient does not exercise routinely and does not follow a lowfat, low salt diet plan.  She is not having cardiac symptoms at present. She denies headache, dizziness, chest pains, shortness of breath, near syncope, or lower extremity edema.   She complains of periodic chest wall pain. She says that upper chest is tender to touch periodically. She has been taking OTC Ibuprofen with moderate relief. She denies recent fall or injury.   [name] is complaining of periodic heartburn after meals.This has been associated with belching, heartburn and eructation.  She denies bilious reflux, choking on food, cough, deep pressure at base of neck, dysphagia, early satiety, hematemesis and hoarseness. Symptoms have been present for several weeks.   She has not lost weight. She denies melena, hematochezia, hematemesis, and coffee ground emesis. She has not attempted any OTC interventions to alleviate symptoms.   Past Medical History:  Diagnosis Date  . Hypertension    Current Outpatient Prescriptions on File Prior to Visit  Medication Sig Dispense Refill  . aspirin EC 81 MG tablet Take 1 tablet (81 mg total) by mouth daily. 90 tablet 2  . atenolol (TENORMIN) 25 MG tablet TAKE 2 TABLETS BY MOUTH DAILY. 30 tablet 1  . atorvastatin (LIPITOR) 20 MG tablet Take 1 tablet (20 mg total) by mouth daily. 90 tablet 3  . Blood Pressure Monitoring (BLOOD PRESSURE CUFF) MISC Take blood pressure once daily and notify your provider if blood pressure is greater than 160/90 1 each 0  . cetirizine (ZYRTEC) 10 MG tablet Take 1  tablet (10 mg total) by mouth daily. 30 tablet 11  . cloNIDine (CATAPRES - DOSED IN MG/24 HR) 0.1 mg/24hr patch Place 1 patch (0.1 mg total) onto the skin once a week. 4 patch 3  . etonogestrel (NEXPLANON) 68 MG IMPL implant 1 each by Subdermal route once.    . hydrochlorothiazide (HYDRODIURIL) 25 MG tablet Take 1 tablet (25 mg total) by mouth daily. 90 tablet 3  . lisinopril (PRINIVIL,ZESTRIL) 20 MG tablet Take 1 tablet (20 mg total) by mouth daily. 90 tablet 3   No current facility-administered medications on file prior to visit.    Social History   Social History  . Marital status: Single    Spouse name: N/A  . Number of children: N/A  . Years of education: N/A   Occupational History  . Not on file.   Social History Main Topics  . Smoking status: Current Every Day Smoker    Years: 5.00    Types: Cigarettes  . Smokeless tobacco: Never Used  . Alcohol use No  . Drug use: No  . Sexual activity: Yes    Birth control/ protection: None   Other Topics Concern  . Not on file   Social History Narrative  . No narrative on file    There is no immunization history on file for this patient. Allergies  Allergen Reactions  . Amlodipine     Really severe headaches     Review of Systems .Review of Systems  Constitutional: Negative.  Negative for diaphoresis and malaise/fatigue.  HENT: Negative.  Negative for hearing loss and  tinnitus.   Eyes: Negative for blurred vision and double vision.  Respiratory: Negative.   Cardiovascular: Negative.        Periodic chest wall pain  Gastrointestinal: Positive for heartburn. Negative for abdominal pain, blood in stool, diarrhea, melena, nausea and vomiting.  Genitourinary: Negative.   Musculoskeletal: Negative.   Skin: Negative.   Neurological: Negative.  Negative for dizziness, weakness and headaches.  Endo/Heme/Allergies: Negative for environmental allergies and polydipsia. Does not bruise/bleed easily.  Psychiatric/Behavioral:  Negative.    Physical Exam  Constitutional: She is well-developed, well-nourished, and in no distress.  HENT:  Head: Normocephalic.  Eyes: Pupils are equal, round, and reactive to light.  Neck: Normal range of motion. No JVD present. No thyromegaly present.  Cardiovascular: Normal rate, regular rhythm, normal heart sounds and intact distal pulses.   No murmur heard. Pulmonary/Chest: Effort normal. She has no decreased breath sounds. She exhibits tenderness.    Abdominal: Soft. Bowel sounds are normal. She exhibits no distension. There is no tenderness.     Objective:     BP (!) 182/110 Comment: manually  Pulse 72   Temp 98.5 F (36.9 C) (Oral)   Resp 14   Ht  (1.549 m)   Wt 189 lb (85.7 kg)   SpO2 100%   BMI 35.71 kg/m  Assessment:    Hypertension, markedly elevated , uncontrolled.  Evidence of target organ damage: Elevated creatine and decreased GFR.    Plan:  1. Accelerated hypertension Administered Clonidine 0.1 mg in office without complication. BP decreased to 160/100. Patient asymptomatic. She was advised to take BP medications as previously prescribed. No medication changes made on today.  Patient will return for a BP check on 11/23/2016 after taking daily medications.  There have been some probable adherence issues with diet and exercise. I have discussed with her the great importance of following the treatment plan exactly as directed in order to achieve a good medical outcome. Recommend dietary sodium restriction Recommend cardiovascular exercise routine 3 days per week (low impact) Previous creatinine level increased, will review as lab results become available.  Reviewed urinalysis, no proteinuria present. Moderate hemoglobin on dipstick. Urinalysis has improved since previous visit.   - COMPLETE METABOLIC PANEL WITH GFR - cloNIDine (CATAPRES) tablet 0.1 mg; Take 1 tablet (0.1 mg total) by mouth once.  2. Chest wall pain Patient advised to refrain from  taking nephrotoxic medications.  Advised to start Tylenol 500 mg every 6 hours as needed for mild to moderate chest wall pain.  - acetaminophen (TYLENOL) 500 MG tablet; Take 1 tablet (500 mg total) by mouth every 6 (six) hours as needed.  Dispense: 30 tablet; Refill: 0  3. Heartburn symptom Patient advised to sit up for 1 hour after meals Refrain from wearing clothing that constricts waist Avoid foods that trigger heartburn.  Heartburn typically does not spread to shoulders, neck or arms. IGiven written information on food that typically trigger heartburn Also, advised to refrain from taking NSAIDs   RTC: BP check on 11/23/2016 with Joaquin Courts, FNP   Nolon Nations  MSN, FNP-C Patient Care [facility] [address] [place], [place] [address] [phone]

## 2016-11-19 ENCOUNTER — Ambulatory Visit: Payer: Self-pay | Admitting: Family Medicine

## 2016-11-19 DIAGNOSIS — I10 Essential (primary) hypertension: Secondary | ICD-10-CM | POA: Insufficient documentation

## 2016-11-19 DIAGNOSIS — R0789 Other chest pain: Secondary | ICD-10-CM | POA: Insufficient documentation

## 2016-11-23 ENCOUNTER — Ambulatory Visit: Payer: Self-pay | Admitting: Family Medicine

## 2016-11-25 MED FILL — LISINOPRIL 20 MG TAB: 20 | 30 days supply | Qty: 30 | Fill #3

## 2016-11-25 MED FILL — ATENOLOL 25 MG TABLET: 25 | 15 days supply | Qty: 30 | Fill #1

## 2016-11-25 MED FILL — ?CETIRIZINE HCL 10 MG TABLE: 10 | 30 days supply | Qty: 30 | Fill #3

## 2016-11-25 MED FILL — cloNIDine 0.1 MG/24HR PTWK: 0.1 | 30 days supply | Qty: 4 | Fill #3

## 2016-11-29 ENCOUNTER — Ambulatory Visit: Payer: Medicaid Other | Admitting: Family Medicine

## 2016-11-29 ENCOUNTER — Encounter: Payer: Self-pay | Admitting: Family Medicine

## 2016-11-29 VITALS — BP 150/90 | HR 55 | Temp 98.0°F | Resp 12 | Ht 61.0 in | Wt 189.5 lb

## 2016-11-29 DIAGNOSIS — K029 Dental caries, unspecified: Secondary | ICD-10-CM

## 2016-11-29 DIAGNOSIS — I10 Essential (primary) hypertension: Secondary | ICD-10-CM

## 2016-11-29 MED ORDER — ATENOLOL 25 MG PO TABS: 50.0000 mg | ORAL_TABLET | Freq: Every day | ORAL | 3 refills | Status: DC

## 2016-11-29 MED ORDER — LISINOPRIL 30 MG PO TABS: 30.0000 mg | ORAL_TABLET | Freq: Every day | ORAL | 2 refills | Status: DC

## 2016-11-29 MED ORDER — CLONIDINE HCL 0.1 MG/24HR TD PTWK: 0.1000 mg | MEDICATED_PATCH | TRANSDERMAL | 3 refills | Status: DC

## 2016-11-29 MED ORDER — HYDROCHLOROTHIAZIDE 25 MG PO TABS: 25.0000 mg | ORAL_TABLET | Freq: Every day | ORAL | 3 refills | Status: DC

## 2016-11-29 NOTE — Patient Instructions (Addendum)
[facility]  [address] [place], [place] [address] Hours:  Open ? Closes 5PM Phone: [phone]  However, I will go ahead process referral for dentistry.   I have increased your Lisinopril 30 mg once daily to improve blood pressure control.    Hypertension Hypertension is another name for high blood pressure. High blood pressure forces your heart to work harder to pump blood. This can cause problems over time. There are two numbers in a blood pressure reading. There is a top number (systolic) over a bottom number (diastolic). It is best to have a blood pressure below 120/80. Healthy choices can help lower your blood pressure. You may need medicine to help lower your blood pressure if:  Your blood pressure cannot be lowered with healthy choices.  Your blood pressure is higher than 130/80.  Follow these instructions at home: Eating and drinking  If directed, follow the DASH eating plan. This diet includes: ? Filling half of your plate at each meal with fruits and vegetables. ? Filling one quarter of your plate at each meal with whole grains. Whole grains include whole wheat pasta, brown rice, and whole grain bread. ? Eating or drinking low-fat dairy products, such as skim milk or low-fat yogurt. ? Filling one quarter of your plate at each meal with low-fat (lean) proteins. Low-fat proteins include fish, skinless chicken, eggs, beans, and tofu. ? Avoiding fatty meat, cured and processed meat, or chicken with skin. ? Avoiding premade or processed food.  Eat less than 1,500 mg of salt (sodium) a day.  Limit alcohol use to no more than 1 drink a day for nonpregnant women and 2 drinks a day for men. One drink equals 12 oz of beer, 5 oz of wine, or 1 oz of hard liquor. Lifestyle  Work with your doctor to stay at a healthy weight or to lose weight. Ask your doctor what the best weight is for you.  Get at least 30 minutes of exercise that causes your heart to  beat faster (aerobic exercise) most days of the week. This may include walking, swimming, or biking.  Get at least 30 minutes of exercise that strengthens your muscles (resistance exercise) at least 3 days a week. This may include lifting weights or pilates.  Do not use any products that contain nicotine or tobacco. This includes cigarettes and e-cigarettes. If you need help quitting, ask your doctor.  Check your blood pressure at home as told by your doctor.  Keep all follow-up visits as told by your doctor. This is important. Medicines  Take over-the-counter and prescription medicines only as told by your doctor. Follow directions carefully.  Do not skip doses of blood pressure medicine. The medicine does not work as well if you skip doses. Skipping doses also puts you at risk for problems.  Ask your doctor about side effects or reactions to medicines that you should watch for. Contact a doctor if:  You think you are having a reaction to the medicine you are taking.  You have headaches that keep coming back (recurring).  You feel dizzy.  You have swelling in your ankles.  You have trouble with your vision. Get help right away if:  You get a very bad headache.  You start to feel confused.  You feel weak or numb.  You feel faint.  You get very bad pain in your: ? Chest. ? Belly (abdomen).  You throw up (vomit) more than once.  You have trouble breathing.  Summary  Hypertension is another name for high blood pressure.  Making healthy choices can help lower blood pressure. If your blood pressure cannot be controlled with healthy choices, you may need to take medicine. This information is not intended to replace advice given to you by your health care provider. Make sure you discuss any questions you have with your health care provider. Document Released: 08/11/2007 Document Revised: 01/21/2016 Document Reviewed: 01/21/2016 Elsevier Interactive Patient Education  [employer].

## 2016-11-29 NOTE — Progress Notes (Signed)
Patient ID: Jennifer Ayers, female    DOB: [birth year], 41 y.o.   MRN: [id number]  PCP: Bing Neighbors, FNP  Chief Complaint  Patient presents with  . Follow-up    blood pressure    Subjective:  HPI Jennifer Ayers is a 41 y.o. female presents for evaluation of accelerated hypertension. [name] was last seen in office on 11/18/2016 for hypertension follow-up and during that visit her BP 160/100. She was treated with oral clonidine in office and advised to return for follow-up today. Kyrene doesn't monitor her blood pressure at home. Reports today her blood pressure is elevated as she hasn't taken her medication. She reports occasionally experiencing frontal headaches which relates to elevated BP. Denies CP, SOB, or dizziness. She requests a dental referral for dental caries and oral hygiene management. Social History   Social History  . Marital status: Single    Spouse name: N/A  . Number of children: N/A  . Years of education: N/A   Occupational History  . Not on file.   Social History Main Topics  . Smoking status: Current Every Day Smoker    Years: 5.00    Types: Cigarettes  . Smokeless tobacco: Never Used  . Alcohol use No  . Drug use: No  . Sexual activity: Yes    Birth control/ protection: None   Other Topics Concern  . Not on file   Social History Narrative  . No narrative on file    Family History  Problem Relation Age of Onset  . Diabetes Mother   . Hypertension Mother   . Diabetes Father   . Hypertension Father    Review of Systems See HPI Patient Active Problem List   Diagnosis Date Noted  . Accelerated hypertension 11/19/2016  . Chest wall pain 11/19/2016  . HTN (hypertension) 07/11/2016  . Right carotid bruit 07/11/2016    Allergies  Allergen Reactions  . Amlodipine     Really severe headaches     Prior to Admission medications   Medication Sig Start Date End Date Taking? Authorizing Provider  acetaminophen (TYLENOL) 500 MG tablet  Take 1 tablet (500 mg total) by mouth every 6 (six) hours as needed. 11/18/16  Yes Massie Maroon, FNP  aspirin EC 81 MG tablet Take 1 tablet (81 mg total) by mouth daily. 07/06/16  Yes Bing Neighbors, FNP  atenolol (TENORMIN) 25 MG tablet TAKE 2 TABLETS BY MOUTH DAILY. 09/30/16  Yes Bing Neighbors, FNP  atorvastatin (LIPITOR) 20 MG tablet Take 1 tablet (20 mg total) by mouth daily. 07/19/16  Yes Bing Neighbors, FNP  Blood Pressure Monitoring (BLOOD PRESSURE CUFF) MISC Take blood pressure once daily and notify your provider if blood pressure is greater than 160/90 07/06/16  Yes Bing Neighbors, FNP  cetirizine (ZYRTEC) 10 MG tablet Take 1 tablet (10 mg total) by mouth daily. 07/06/16  Yes Bing Neighbors, FNP  cloNIDine (CATAPRES - DOSED IN MG/24 HR) 0.1 mg/24hr patch Place 1 patch (0.1 mg total) onto the skin once a week. 07/13/16  Yes Bing Neighbors, FNP  etonogestrel (NEXPLANON) 68 MG IMPL implant 1 each by Subdermal route once.   Yes [provider]  hydrochlorothiazide (HYDRODIURIL) 25 MG tablet Take 1 tablet (25 mg total) by mouth daily. 07/06/16  Yes Bing Neighbors, FNP  lisinopril (PRINIVIL,ZESTRIL) 20 MG tablet Take 1 tablet (20 mg total) by mouth daily. 07/06/16  Yes Bing Neighbors, FNP    Past Medical, Surgical Family and  Social History reviewed and updated.    Objective:   Today's Vitals   11/29/16 1409  BP: (!) 150/90  Pulse: (!) 55  Resp: 12  Temp: 98 F (36.7 C)  TempSrc: Oral  SpO2: 100%  Weight: 189 lb 8 oz (86 kg)  Height:  (1.549 m)    Wt Readings from Last 3 Encounters:  11/29/16 189 lb 8 oz (86 kg)  11/18/16 189 lb (85.7 kg)  08/10/16 180 lb 6.4 oz (81.8 kg)    Physical Exam  Constitutional: She is oriented to person, place, and time. She appears well-developed and well-nourished.  HENT:  Head: Normocephalic and atraumatic.  Eyes: Pupils are equal, round, and reactive to light. Conjunctivae and EOM are normal.  Neck: Normal  range of motion. Neck supple.  Cardiovascular: Normal rate, regular rhythm, normal heart sounds and intact distal pulses.   Pulmonary/Chest: Effort normal and breath sounds normal.  Neurological: She is alert and oriented to person, place, and time.  Skin: Skin is warm and dry.  Psychiatric: She has a normal mood and affect. Her behavior is normal. Judgment and thought content normal.   Assessment & Plan:  1. Essential hypertension, uncontrolled. Take medication prior to follow-up visit. Check BP at least once weekly at retail store and follow-up if BP consistently greater than 160/90. I am increasing lisinopril 30 mg once daily to achieve BP goal of less than 140/90  2. Dental caries - Ambulatory referral to Dentistry -Gave information for free clinic at [facility]  RTC: 2 months hypertension follow-up  Godfrey Pick. Tiburcio Pea, MSN, FNP-C The Patient Care [facility]  [address] [place] Maroon [place], [place] [address] [phone]

## 2016-12-22 ENCOUNTER — Telehealth: Payer: Self-pay

## 2016-12-22 NOTE — Telephone Encounter (Signed)
Patient had a dental exam on 12/21/2016. Note will be sent to scan.

## 2017-01-19 ENCOUNTER — Ambulatory Visit: Payer: Self-pay | Attending: Internal Medicine

## 2017-02-08 ENCOUNTER — Other Ambulatory Visit: Payer: Self-pay | Admitting: Family Medicine

## 2017-02-08 MED FILL — cloNIDine 0.1 MG/24HR PTWK: 0.1 | 28 days supply | Qty: 4 | Fill #0

## 2017-02-09 ENCOUNTER — Other Ambulatory Visit: Payer: Self-pay | Admitting: Family Medicine

## 2017-02-09 MED ORDER — ATENOLOL 25 MG PO TABS: 50.0000 mg | ORAL_TABLET | Freq: Every day | ORAL | 3 refills | Status: DC

## 2017-02-09 MED FILL — ATENOLOL 25 MG TABLET: 25 | 15 days supply | Qty: 30 | Fill #0

## 2017-02-09 MED FILL — LISINOPRIL 10 MG TABS: 10 | 30 days supply | Qty: 90 | Fill #0

## 2017-02-09 NOTE — Progress Notes (Signed)
Refilled atenolol

## 2017-02-25 ENCOUNTER — Ambulatory Visit (INDEPENDENT_AMBULATORY_CARE_PROVIDER_SITE_OTHER): Payer: Self-pay | Admitting: Family Medicine

## 2017-02-25 ENCOUNTER — Other Ambulatory Visit: Payer: Self-pay | Admitting: Family Medicine

## 2017-02-25 ENCOUNTER — Encounter: Payer: Self-pay | Admitting: Family Medicine

## 2017-02-25 VITALS — BP 160/110 | HR 82 | Temp 97.9°F | Resp 16 | Ht 61.0 in | Wt 192.0 lb

## 2017-02-25 DIAGNOSIS — Z1231 Encounter for screening mammogram for malignant neoplasm of breast: Secondary | ICD-10-CM

## 2017-02-25 DIAGNOSIS — R0789 Other chest pain: Secondary | ICD-10-CM

## 2017-02-25 DIAGNOSIS — I10 Essential (primary) hypertension: Secondary | ICD-10-CM

## 2017-02-25 MED ORDER — LISINOPRIL 40 MG PO TABS: 40.0000 mg | ORAL_TABLET | Freq: Every day | ORAL | 1 refills | Status: DC

## 2017-02-25 NOTE — Progress Notes (Addendum)
Patient ID: Jennifer Ayers, female    DOB: November 22, 1975, 41 y.o.   MRN: [id number]  PCP: Bing NeighborsHarris, Dvid Pendry S, FNP  Chief Complaint  Patient presents with  . Follow-up    3 month    Subjective:  HPI Jennifer Ayers Shelter[place] is a 41 y.o. female, with accelerated hypertension, presents for 3 month follow-up. Jennifer Ayers reports no home monitoring of blood pressure due to not having access to a blood pressure cuff. She reports adherence to current medication regimen. During her last office visit, she was found to be hypertensive and in lieu of reported compliance with medication regimen. Unfortunately, she continues to smoke in spite of extensive counseling regarding smoking cessation. Reports doesn't adhere to low sodium diet. Jennifer Ayers has known carotid right sided bruit. She denies dizziness, headaches, or shortness of breath. Jennifer Ayers complains today of intermittent mid-sternal chest pain that has seemed to progress over the course of 2-3 months. Initially, she attributed symptoms to GI related symptoms. Characterizes sharp, piercing like pain that often radiates to her mid -back. Duration of pain is about 10 minutes. Chest pain occurs regardless of activity. Denies shortness of breath although at times when pain occurs, during the episode, feels the sensation as if her breath is taken away. She reports associated burping, burning of chest, and symptoms occasionally are relived with antacids. Jennifer Ayers has a significant family history for CAD as her brother died abruptly from an MI at 40 years old and her father died of an MI at age 40. Current Body mass index is 36.28 kg/m.  Social History   Socioeconomic History  . Marital status: Single    Spouse name: Not on file  . Number of children: Not on file  . Years of education: Not on file  . Highest education level: Not on file  Social Needs  . Financial resource strain: Not on file  . Food insecurity - worry: Not on file  . Food insecurity - inability: Not  on file  . Transportation needs - medical: Not on file  . Transportation needs - non-medical: Not on file  Occupational History  . Not on file  Tobacco Use  . Smoking status: Current Every Day Smoker    Years: 5.00    Types: Cigarettes  . Smokeless tobacco: Never Used  Substance and Sexual Activity  . Alcohol use: No  . Drug use: No  . Sexual activity: Yes    Birth control/protection: None  Other Topics Concern  . Not on file  Social History Narrative  . Not on file    Family History  Problem Relation Age of Onset  . Diabetes Mother   . Hypertension Mother   . Diabetes Father   . Hypertension Father    Review of Systems  Constitutional: Negative.  Negative for fatigue.  HENT: Negative.   Respiratory: Negative.   Cardiovascular: Positive for chest pain. Negative for palpitations and leg swelling.  Musculoskeletal: Negative.   Neurological: Negative for headaches.  Psychiatric/Behavioral: Negative.     Patient Active Problem List   Diagnosis Date Noted  . Accelerated hypertension 11/19/2016  . Chest wall pain 11/19/2016  . HTN (hypertension) 07/11/2016  . Right carotid bruit 07/11/2016    Allergies  Allergen Reactions  . Amlodipine     Really severe headaches     Prior to Admission medications   Medication Sig Start Date End Date Taking? Authorizing Provider  acetaminophen (TYLENOL) 500 MG tablet Take 1 tablet (500 mg total) by mouth every 6 (six) hours  as needed. 11/18/16  Yes Massie MaroonHollis, Lachina M, FNP  aspirin EC 81 MG tablet Take 1 tablet (81 mg total) by mouth daily. 07/06/16  Yes Bing NeighborsHarris, Denisse Whitenack S, FNP  atenolol (TENORMIN) 25 MG tablet TAKE 2 TABLETS BY MOUTH DAILY. 02/09/17  Yes Bing NeighborsHarris, Ewald Beg S, FNP  atorvastatin (LIPITOR) 20 MG tablet Take 1 tablet (20 mg total) by mouth daily. 07/19/16  Yes Bing NeighborsHarris, Leonardo Plaia S, FNP  Blood Pressure Monitoring (BLOOD PRESSURE CUFF) MISC Take blood pressure once daily and notify your provider if blood pressure is greater than  160/90 07/06/16  Yes Bing NeighborsHarris, Lacole Komorowski S, FNP  cetirizine (ZYRTEC) 10 MG tablet Take 1 tablet (10 mg total) by mouth daily. 07/06/16  Yes Bing NeighborsHarris, Ethaniel Garfield S, FNP  cloNIDine (CATAPRES - DOSED IN MG/24 HR) 0.1 mg/24hr patch Place 1 patch (0.1 mg total) onto the skin once a week. 11/29/16  Yes Bing NeighborsHarris, Odeth Bry S, FNP  etonogestrel (NEXPLANON) 68 MG IMPL implant 1 each by Subdermal route once.   Yes [provider]  hydrochlorothiazide (HYDRODIURIL) 25 MG tablet Take 1 tablet (25 mg total) by mouth daily. 11/29/16  Yes Bing NeighborsHarris, Hazem Kenner S, FNP  lisinopril (PRINIVIL,ZESTRIL) 30 MG tablet Take 1 tablet (30 mg total) by mouth daily. 11/29/16  Yes Bing NeighborsHarris, Susann Lawhorne S, FNP    Past Medical, Surgical Family and Social History reviewed and updated.    Objective:   Today's Vitals   02/25/17 1429  BP: (!) 160/110  Pulse: 82  Resp: 16  Temp: 97.9 F (36.6 C)  TempSrc: Oral  SpO2: 100%  Weight: 192 lb (87.1 kg)  Height: 5\' 1"  (1.549 m)    Wt Readings from Last 3 Encounters:  02/25/17 192 lb (87.1 kg)  11/29/16 189 lb 8 oz (86 kg)  11/18/16 189 lb (85.7 kg)   Physical Exam  Constitutional: She is oriented to person, place, and time. She appears well-developed and well-nourished.  HENT:  Head: Normocephalic and atraumatic.  Eyes: Conjunctivae and EOM are normal. Pupils are equal, round, and reactive to light.  Neck: Normal range of motion. Neck supple.  Cardiovascular: Regular rhythm and normal heart sounds. Bradycardia present.  EKG remarkable for Sinus Bradycardia and Sinus Arrhythmia , ST changes and ? Inferiorolateral ischemia.   Pulmonary/Chest: Effort normal and breath sounds normal.  Musculoskeletal: Normal range of motion.  Neurological: She is alert and oriented to person, place, and time.  Skin: Skin is warm and dry.  Psychiatric: She has a normal mood and affect. Her behavior is normal. Judgment and thought content normal.   Assessment & Plan:  1. Accelerated hypertension,  uncontrolled. Improved only slightly with rest. She has not taken full dose of Lisinopril, although blood pressure has been only minimally controlled and not at goal of <140/90. Remains sedentary, continues to smoke daily, and current Body mass index is 36.28 kg/m. Today, I am increasing lisinopril 40 mg once daily a d decreasing atenolol 25 mg once daily due to sinus bradycardia. Advised to return in one week for BP check. Will consider adding  Amlodipine and discontinuing atenolol if BP not at goal.  2. Other chest pain, Risk factors for cardiovascular event 29%, risk factors for CAD smoking, obesity, family history, sedentary lifestyle, and accelerated hypertension. Currently on statin and aspirin therapy. Chest pain suspicious for ischemia and is likely angina  due to pain has worsened in character over time. Referral emergently placed to cariology for further evaluation. Patient educated extensively regarding precautions for when to go immediately to ED for further  evaluation.  The 10-year ASCVD risk score Denman George DC Montez Hageman., et al., 2013) is: 29%   Values used to calculate the score:     Age: 5 years     Sex: Female     Is Non-Hispanic African American: Yes     Diabetic: No     Tobacco smoker: Yes     Systolic Blood Pressure: 160 mmHg     Is BP treated: Yes     HDL Cholesterol: 35 mg/dL     Total Cholesterol: 226 mg/dL - EKG 14-NWGN - Ambulatory referral to Cardiology  3. Bradycardia , asymptomatic, decreased Atenolol 5 mg once daily. If heart rate continues to remain low, will d/c and start amlodipine.  Return in 1 week for BP check.   -The patient was given clear instructions to go to ER or return to medical center if symptoms do not improve, worsen or new problems develop. The patient verbalized understanding.  Godfrey Pick. Tiburcio Pea, MSN, FNP-C The Patient Care [facility]  [address] [place], [place] [address] [phone]

## 2017-02-25 NOTE — Patient Instructions (Addendum)
I am decreasing your atenolol 25 mg once daily  I am increasing your lisinopril 40 mg once daily. New order sent to pharmacy.  Continue your other medications as prescribed.      Angina Pectoris Angina pectoris is a very bad feeling in the chest, neck, or arm. Your doctor may call it angina. There are four types of angina. Angina is caused by a lack of blood in the middle and thickest layer of the heart wall (myocardium). Angina may feel like a crushing or squeezing pain in the chest. It may feel like tightness or heavy pressure in the chest. Some people say it feels like gas, heartburn, or indigestion. Some people have symptoms other than pain. These include:  Shortness of breath.  Cold sweats.  Feeling sick to your stomach (nausea).  Feeling light-headed.  Many women have chest discomfort and some of the other symptoms. However, women often have different symptoms, such as:  Feeling tired (fatigue).  Feeling nervous for no reason.  Feeling weak for no reason.  Dizziness or fainting.  Women may have angina without any symptoms. Follow these instructions at home:  Take medicines only as told by your doctor.  Take care of other health issues as told by your doctor. These include: ? High blood pressure (hypertension). ? Diabetes.  Follow a heart-healthy diet. Your doctor can help you to choose healthy food options and make changes.  Talk to your doctor to learn more about healthy cooking methods and use them. These include: ? Roasting. ? Grilling. ? Broiling. ? Baking. ? Poaching. ? Steaming. ? Stir-frying.  Follow an exercise program approved by your doctor.  Keep a healthy weight. Lose weight as told by your doctor.  Rest when you are tired.  Learn to manage stress.  Do not use any tobacco, such as cigarettes, chewing tobacco, or electronic cigarettes. If you need help quitting, ask your doctor.  If you drink alcohol, and your doctor says it is okay, limit  yourself to no more than 1 drink per day. One drink equals 12 ounces of beer, 5 ounces of wine, or 1 ounces of hard liquor.  Stop illegal drug use.  Keep all follow-up visits as told by your doctor. This is important. Do not take these medicines unless your doctor says that you can:  Nonsteroidal anti-inflammatory drugs (NSAIDs). These include: ? Ibuprofen. ? Naproxen. ? Celecoxib.  Vitamin supplements that have vitamin A, vitamin E, or both.  Hormone therapy that contains estrogen with or without progestin.  Get help right away if:  You have pain in your chest, neck, arm, jaw, stomach, or back that: ? Lasts more than a few minutes. ? Comes back. ? Does not get better after you take medicine under your tongue (sublingual nitroglycerin).  You have any of these symptoms for no reason: ? Gas, heartburn, or indigestion. ? Sweating a lot. ? Shortness of breath or trouble breathing. ? Feeling sick to your stomach or throwing up. ? Feeling more tired than usual. ? Feeling nervous or worrying more than usual. ? Feeling weak. ? Diarrhea.  You are suddenly dizzy or light-headed.  You faint or pass out. These symptoms may be an emergency. Do not wait to see if the symptoms will go away. Get medical help right away. Call your local emergency services (911 in the [place] Do not drive yourself to the hospital. This information is not intended to replace advice given to you by your health care provider. Make sure you discuss  any questions you have with your health care provider. Document Released: 08/11/2007 Document Revised: 07/31/2015 Document Reviewed: 06/26/2013 Elsevier Interactive Patient Education  2017 Elsevier Inc.  Hypertension Hypertension is another name for high blood pressure. High blood pressure forces your heart to work harder to pump blood. This can cause problems over time. There are two numbers in a blood pressure reading. There is a top number (systolic) over a bottom  number (diastolic). It is best to have a blood pressure below 120/80. Healthy choices can help lower your blood pressure. You may need medicine to help lower your blood pressure if:  Your blood pressure cannot be lowered with healthy choices.  Your blood pressure is higher than 130/80.  Follow these instructions at home: Eating and drinking  If directed, follow the DASH eating plan. This diet includes: ? Filling half of your plate at each meal with fruits and vegetables. ? Filling one quarter of your plate at each meal with whole grains. Whole grains include whole wheat pasta, brown rice, and whole grain bread. ? Eating or drinking low-fat dairy products, such as skim milk or low-fat yogurt. ? Filling one quarter of your plate at each meal with low-fat (lean) proteins. Low-fat proteins include fish, skinless chicken, eggs, beans, and tofu. ? Avoiding fatty meat, cured and processed meat, or chicken with skin. ? Avoiding premade or processed food.  Eat less than 1,500 mg of salt (sodium) a day.  Limit alcohol use to no more than 1 drink a day for nonpregnant women and 2 drinks a day for men. One drink equals 12 oz of beer, 5 oz of wine, or 1 oz of hard liquor. Lifestyle  Work with your doctor to stay at a healthy weight or to lose weight. Ask your doctor what the best weight is for you.  Get at least 30 minutes of exercise that causes your heart to beat faster (aerobic exercise) most days of the week. This may include walking, swimming, or biking.  Get at least 30 minutes of exercise that strengthens your muscles (resistance exercise) at least 3 days a week. This may include lifting weights or pilates.  Do not use any products that contain nicotine or tobacco. This includes cigarettes and e-cigarettes. If you need help quitting, ask your doctor.  Check your blood pressure at home as told by your doctor.  Keep all follow-up visits as told by your doctor. This is  important. Medicines  Take over-the-counter and prescription medicines only as told by your doctor. Follow directions carefully.  Do not skip doses of blood pressure medicine. The medicine does not work as well if you skip doses. Skipping doses also puts you at risk for problems.  Ask your doctor about side effects or reactions to medicines that you should watch for. Contact a doctor if:  You think you are having a reaction to the medicine you are taking.  You have headaches that keep coming back (recurring).  You feel dizzy.  You have swelling in your ankles.  You have trouble with your vision. Get help right away if:  You get a very bad headache.  You start to feel confused.  You feel weak or numb.  You feel faint.  You get very bad pain in your: ? Chest. ? Belly (abdomen).  You throw up (vomit) more than once.  You have trouble breathing. Summary  Hypertension is another name for high blood pressure.  Making healthy choices can help lower blood pressure. If your blood  pressure cannot be controlled with healthy choices, you may need to take medicine. This information is not intended to replace advice given to you by your health care provider. Make sure you discuss any questions you have with your health care provider. Document Released: 08/11/2007 Document Revised: 01/21/2016 Document Reviewed: 01/21/2016 Elsevier Interactive Patient Education  [employer].

## 2017-03-02 LAB — POCT URINALYSIS DIP (DEVICE)
Bilirubin Urine: NEGATIVE
GLUCOSE, UA: NEGATIVE mg/dL
KETONES UR: NEGATIVE mg/dL
LEUKOCYTES UA: NEGATIVE
Nitrite: NEGATIVE
Protein, ur: 100 mg/dL — AB
SPECIFIC GRAVITY, URINE: 1.025 (ref 1.005–1.030)
Urobilinogen, UA: 0.2 mg/dL (ref 0.0–1.0)
pH: 6 (ref 5.0–8.0)

## 2017-03-10 MED FILL — ?ATENOLOL 25MG TABLET: 25 | 15 days supply | Qty: 30 | Fill #1

## 2017-03-10 MED FILL — LISINOPRIL 40 MG TAB: 40 | 30 days supply | Qty: 30 | Fill #0

## 2017-03-15 ENCOUNTER — Encounter: Payer: Self-pay | Admitting: Family Medicine

## 2017-03-15 ENCOUNTER — Telehealth: Payer: Self-pay

## 2017-03-15 ENCOUNTER — Ambulatory Visit (INDEPENDENT_AMBULATORY_CARE_PROVIDER_SITE_OTHER): Payer: Self-pay | Admitting: Family Medicine

## 2017-03-15 VITALS — BP 158/94 | HR 64 | Temp 97.7°F | Resp 16 | Ht 61.0 in | Wt 192.2 lb

## 2017-03-15 DIAGNOSIS — H1012 Acute atopic conjunctivitis, left eye: Secondary | ICD-10-CM

## 2017-03-15 DIAGNOSIS — J309 Allergic rhinitis, unspecified: Secondary | ICD-10-CM

## 2017-03-15 MED ORDER — OLOPATADINE HCL 0.1 % OP SOLN: 1.0000 [drp] | Freq: Two times a day (BID) | OPHTHALMIC | 12 refills | Status: DC

## 2017-03-15 MED ORDER — PREDNISONE 20 MG PO TABS: 40.0000 mg | ORAL_TABLET | Freq: Every day | ORAL | 0 refills | Status: DC

## 2017-03-15 MED FILL — predniSONE 20 MG TABS: 20 | 5 days supply | Qty: 10 | Fill #0

## 2017-03-15 MED FILL — OLOPATADINE HCL 0.1% EYE DR: 0.1 | 20 days supply | Qty: 5 | Fill #0

## 2017-03-15 NOTE — Telephone Encounter (Signed)
Per Alonna MiniumNorma Ayers at cardiology they were unable to reach patient for appointment and referral has been closed

## 2017-03-15 NOTE — Progress Notes (Deleted)
Patient ID: Jennifer Ayers, female    DOB: [birth year], 42 y.o.   MRN: [id number]  PCP: Bing Neighbors, FNP  Chief Complaint  Patient presents with  . Conjunctivitis    Subjective:  HPI  Jennifer Ayers is a 42 y.o. female presents for evaluation of   Cheek bones itch. Puffiness in morning and seal shut with discharge. History of seasonal allergies. Nasal congestion.    Social History   Socioeconomic History  . Marital status: Single    Spouse name: Not on file  . Number of children: Not on file  . Years of education: Not on file  . Highest education level: Not on file  Social Needs  . Financial resource strain: Not on file  . Food insecurity - worry: Not on file  . Food insecurity - inability: Not on file  . Transportation needs - medical: Not on file  . Transportation needs - non-medical: Not on file  Occupational History  . Not on file  Tobacco Use  . Smoking status: Current Every Day Smoker    Years: 5.00    Types: Cigarettes  . Smokeless tobacco: Never Used  Substance and Sexual Activity  . Alcohol use: No  . Drug use: No  . Sexual activity: Yes    Birth control/protection: None  Other Topics Concern  . Not on file  Social History Narrative  . Not on file    Family History  Problem Relation Age of Onset  . Diabetes Mother   . Hypertension Mother   . Diabetes Father   . Hypertension Father      Review of Systems  Patient Active Problem List   Diagnosis Date Noted  . Accelerated hypertension 11/19/2016  . Chest wall pain 11/19/2016  . HTN (hypertension) 07/11/2016  . Right carotid bruit 07/11/2016    Allergies  Allergen Reactions  . Amlodipine     Really severe headaches     Prior to Admission medications   Medication Sig Start Date End Date Taking? Authorizing Provider  acetaminophen (TYLENOL) 500 MG tablet Take 1 tablet (500 mg total) by mouth every 6 (six) hours as needed. 11/18/16  Yes Massie Maroon, FNP  aspirin EC 81 MG  tablet Take 1 tablet (81 mg total) by mouth daily. 07/06/16  Yes Bing Neighbors, FNP  atenolol (TENORMIN) 25 MG tablet TAKE 2 TABLETS BY MOUTH DAILY. 02/09/17  Yes Bing Neighbors, FNP  atorvastatin (LIPITOR) 20 MG tablet Take 1 tablet (20 mg total) by mouth daily. 07/19/16  Yes Bing Neighbors, FNP  Blood Pressure Monitoring (BLOOD PRESSURE CUFF) MISC Take blood pressure once daily and notify your provider if blood pressure is greater than 160/90 07/06/16  Yes Bing Neighbors, FNP  cetirizine (ZYRTEC) 10 MG tablet Take 1 tablet (10 mg total) by mouth daily. 07/06/16  Yes Bing Neighbors, FNP  cloNIDine (CATAPRES - DOSED IN MG/24 HR) 0.1 mg/24hr patch Place 1 patch (0.1 mg total) onto the skin once a week. 11/29/16  Yes Bing Neighbors, FNP  etonogestrel (NEXPLANON) 68 MG IMPL implant 1 each by Subdermal route once.   Yes [provider]  hydrochlorothiazide (HYDRODIURIL) 25 MG tablet Take 1 tablet (25 mg total) by mouth daily. 11/29/16  Yes Bing Neighbors, FNP  lisinopril (PRINIVIL,ZESTRIL) 40 MG tablet Take 1 tablet (40 mg total) by mouth daily. 02/25/17  Yes Bing Neighbors, FNP    Past Medical, Surgical Family and Social History reviewed and updated.  Objective:   Today's Vitals   03/15/17 0942 03/15/17 1017  BP: (!) 160/90 (!) 158/94  Pulse: 64   Resp: 16   Temp: 97.7 F (36.5 C)   TempSrc: Oral   SpO2: 100%   Weight: 192 lb 3.2 oz (87.2 kg)   Height: 5\' 1"  (1.549 m)     Wt Readings from Last 3 Encounters:  03/15/17 192 lb 3.2 oz (87.2 kg)  02/25/17 192 lb (87.1 kg)  11/29/16 189 lb 8 oz (86 kg)    Physical Exam         Assessment & Plan:  There are no diagnoses linked to this encounter.   Godfrey PickKimberly S. Tiburcio PeaHarris, MSN, FNP-C [facility]  [address] [place], [place] [address] [phone]

## 2017-03-15 NOTE — Patient Instructions (Addendum)
Contact cardiology to reschedule cardiology visit at [phone], asap    Allergic Conjunctivitis A clear membrane (conjunctiva) covers the white part of your eye and the inner surface of your eyelid. Allergic conjunctivitis happens when this membrane has inflammation. This is caused by allergies. Common causes of allergic reactions (allergens)include:  Outdoor allergens, such as: ? Pollen. ? Grass and weeds. ? Mold spores.  Indoor allergens, such as: ? Dust. ? Smoke. ? Mold. ? Pet dander. ? Animal hair.  This condition can make your eye red or pink. It can also make your eye feel itchy. This condition cannot be spread from one person to another person (is not contagious). Follow these instructions at home:  Try not to be around things that you are allergic to.  Take or apply over-the-counter and prescription medicines only as told by your doctor. These include any eye drops.  Place a cool, clean washcloth on your eye for 10-20 minutes. Do this 3-4 times a day.  Do not touch or rub your eyes.  Do not wear contact lenses until the inflammation is gone. Wear glasses instead.  Do not wear eye makeup until the inflammation is gone.  Keep all follow-up visits as told by your doctor. This is important. Contact a doctor if:  Your symptoms get worse.  Your symptoms do not get better with treatment.  You have mild eye pain.  You are sensitive to light,  You have spots or blisters on your eyes.  You have pus coming from your eye.  You have a fever. Get help right away if:  You have redness, swelling, or other symptoms in only one eye.  Your vision is blurry.  You have vision changes.  You have very bad eye pain. Summary  Allergic conjunctivitis is caused by allergies. It can make your eye red or pink, and it can make your eye feel itchy.  This condition cannot be spread from one person to another person (is not contagious).  Try not to be around things that you  are allergic to.  Take or apply over-the-counter and prescription medicines only as told by your doctor. These include any eye drops.  Contact your doctor if your symptoms get worse or they do not get better with treatment. This information is not intended to replace advice given to you by your health care provider. Make sure you discuss any questions you have with your health care provider. Document Released: 08/12/2009 Document Revised: 10/17/2015 Document Reviewed: 10/17/2015 Elsevier Interactive Patient Education  2017 [employer]

## 2017-03-17 NOTE — Telephone Encounter (Signed)
Patient was a no show to cardiology appointment.

## 2017-03-19 NOTE — Progress Notes (Signed)
Jennifer Ayers  [birth year] [id number]   Chief Complaint  Patient presents with  . Conjunctivitis    Subjective:    Jennifer Ayers is a 42 y.o. female who presents for evaluation of discharge and erythema in both eyes. She has noticed the above symptoms for 1 day. Onset was acute. Patient denies blurred vision, foreign body sensation, photophobia and visual field deficit. There is a history of allergies. Reports associated nasal congestion and drainage over the course of 1 week. Currently not taking any medication for environmental allergies. Denies fever, headache, ear pain, or facial pain.  Social History   Socioeconomic History  . Marital status: Single    Spouse name: Not on file  . Number of children: Not on file  . Years of education: Not on file  . Highest education level: Not on file  Social Needs  . Financial resource strain: Not on file  . Food insecurity - worry: Not on file  . Food insecurity - inability: Not on file  . Transportation needs - medical: Not on file  . Transportation needs - non-medical: Not on file  Occupational History  . Not on file  Tobacco Use  . Smoking status: Current Every Day Smoker    Years: 5.00    Types: Cigarettes  . Smokeless tobacco: Never Used  Substance and Sexual Activity  . Alcohol use: No  . Drug use: No  . Sexual activity: Yes    Birth control/protection: None  Other Topics Concern  . Not on file  Social History Narrative  . Not on file    Family History  Problem Relation Age of Onset  . Diabetes Mother   . Hypertension Mother   . Diabetes Father   . Hypertension Father    The following portions of the patient's history were reviewed and updated as appropriate: allergies, current medications, past family history, past medical history, past social history, past surgical history and problem list.  Review of Systems Constitutional: negative Eyes: positive for irritation and redness Ears, nose, mouth, throat, and face:  positive for nasal congestion Respiratory: negative Cardiovascular: negative   Objective:    BP (!) 158/94   Pulse 64   Temp 97.7 F (36.5 C) (Oral)   Resp 16   Ht 5\' 1"  (1.549 m)   Wt 192 lb 3.2 oz (87.2 kg)   LMP 02/15/2017   SpO2 100%   BMI 36.32 kg/m        Physical Exam  Constitutional: She is oriented to person, place, and time. She appears well-developed and well-nourished.  HENT:  Head: Normocephalic and atraumatic.  Right Ear: Hearing, tympanic membrane, external ear and ear canal normal.  Left Ear: Hearing, tympanic membrane, external ear and ear canal normal.  Nose: Rhinorrhea present. Right sinus exhibits no maxillary sinus tenderness and no frontal sinus tenderness. Left sinus exhibits no maxillary sinus tenderness and no frontal sinus tenderness.  Mouth/Throat: Uvula is midline and oropharynx is clear and moist.  Eyes: EOM are normal. Pupils are equal, round, and reactive to light. Right eye exhibits no discharge and no exudate. No foreign body present in the right eye. Left eye exhibits discharge. Left eye exhibits no exudate. No foreign body present in the left eye. Left conjunctiva is not injected. Left conjunctiva has no hemorrhage.  Watery serous discharge and erythematous sclera    Neck: Normal range of motion. Neck supple. No thyromegaly present.  Cardiovascular: Normal rate, regular rhythm, normal heart sounds and intact distal pulses.  Respiratory: Effort  normal and breath sounds normal.  Musculoskeletal: Normal range of motion.  Lymphadenopathy:    She has no cervical adenopathy.  Neurological: She is alert and oriented to person, place, and time.  Skin: Skin is warm and dry.  Psychiatric: She has a normal mood and affect. Her behavior is normal. Judgment and thought content normal.   Assessment:   1.Allergic conjunctivitis, new  2. Allergic Rhinitis, new  3. Accelerated Hypertension, uncontrollled    Plan:   Discussed the diagnosis and proper care  of conjunctivitis.  Ophthalmic drops prescribed per order. Stressed household Presenter, broadcasting.Trial a short course of prednisone to improve nasal congestion.  Resume daily Zyrtec as prescribed   -Discussed again with patient the importance of antihypertension medication management and taking medication consistently.  Also Jennifer Ayers was referred to cardiology for further evaluation of abnormal EKG and per referral note, patient never returned call to schedule appointment. Provided the phone number to [facility] in order for patient to follow-up.  Meds ordered this encounter  Medications  . predniSONE (DELTASONE) 20 MG tablet    Sig: Take 2 tablets (40 mg total) by mouth daily with breakfast.    Dispense:  10 tablet    Refill:  0    Order Specific Question:   Supervising Provider    Answer:   Quentin Angst [id number]  . olopatadine (PATANOL) 0.1 % ophthalmic solution    Sig: Place 1 drop into both eyes 2 (two) times daily.    Dispense:  5 mL    Refill:  12    Order Specific Question:   Supervising Provider    Answer:   Quentin Angst [[id number]]    RTC: Keep scheduled follow-up on file.  Return to work note provided.   A total of 20 minutes spent, greater than 50 % of this time was spent reviewing prior medical history, reviewing medications and indications of treatment, discussing current plan of treatment, health promotion, and goals of treatment.  Godfrey Pick. Tiburcio Pea, MSN, FNP-C [facility]  [address] [place], [place] [address] [phone]

## 2017-03-19 NOTE — Progress Notes (Deleted)
Subjective:    Jennifer Ayers is a 42 y.o. female who presents for evaluation of erythema, itching and tearing in both eyes. She has noticed the above symptoms for {1-10:13787} {time; units:18646}. Onset was {pain onset:16558}. Patient denies {eye symptoms:16443}. There is a history of {eye hx:16612}.  {Common ambulatory SmartLinks:19316}  Review of Systems {ros - complete:30496}   Objective:    BP (!) 158/94   Pulse 64   Temp 97.7 F (36.5 C) (Oral)   Resp 16   Ht 5\' 1"  (1.549 m)   Wt 192 lb 3.2 oz (87.2 kg)   LMP 02/15/2017   SpO2 100%   BMI 36.32 kg/m       General: {gen appearance:16600}  Eyes:  {findings; exam eye:201::"conjunctivae/corneas clear. PERRL, EOM's intact. Fundi benign."}  Vision: {vision:16622}  Fluorescein:  {fluorescein:16712}     Assessment:    {eye dx:16571::"Acute conjunctivitis"}   Plan:    {eye tx plan:14155}

## 2017-03-24 ENCOUNTER — Ambulatory Visit
Admission: RE | Admit: 2017-03-24 | Discharge: 2017-03-24 | Disposition: A | Discharge status: Home / Self Care | Payer: Self-pay | Source: Ambulatory Visit | Attending: Family Medicine | Admitting: Family Medicine

## 2017-03-24 DIAGNOSIS — Z1231 Encounter for screening mammogram for malignant neoplasm of breast: Secondary | ICD-10-CM

## 2017-04-18 ENCOUNTER — Other Ambulatory Visit: Payer: Self-pay | Admitting: Family Medicine

## 2017-04-18 ENCOUNTER — Encounter: Payer: Self-pay | Admitting: Cardiology

## 2017-04-18 ENCOUNTER — Ambulatory Visit (INDEPENDENT_AMBULATORY_CARE_PROVIDER_SITE_OTHER): Payer: Self-pay | Admitting: Cardiology

## 2017-04-18 VITALS — BP 134/82 | HR 93 | Ht 61.0 in | Wt 188.0 lb

## 2017-04-18 DIAGNOSIS — R0609 Other forms of dyspnea: Secondary | ICD-10-CM

## 2017-04-18 DIAGNOSIS — R0789 Other chest pain: Secondary | ICD-10-CM

## 2017-04-18 DIAGNOSIS — E7800 Pure hypercholesterolemia, unspecified: Secondary | ICD-10-CM

## 2017-04-18 DIAGNOSIS — I10 Essential (primary) hypertension: Secondary | ICD-10-CM

## 2017-04-18 MED FILL — ?CETIRIZINE HCL 10 MG TABLE: 10 | 30 days supply | Qty: 30 | Fill #4

## 2017-04-18 MED FILL — LISINOPRIL 40 MG TAB: 40 | 30 days supply | Qty: 30 | Fill #1

## 2017-04-18 MED FILL — ?ATENOLOL 25MG TABLET: 25 | 15 days supply | Qty: 30 | Fill #0

## 2017-04-18 NOTE — Patient Instructions (Signed)
Medication Instructions:  Your physician recommends that you continue on your current medications as directed. Please refer to the Current Medication list given to you today.  Labwork: Your physician recommends that you have lab work today: BMP, CBC, Magnesium, TSH, Liver function, lipid and Urine Pregnancy.  Testing/Procedures: Your physician has requested that you have an echocardiogram. Echocardiography is a painless test that uses sound waves to create images of your heart. It provides your doctor with information about the size and shape of your heart and how well your heart's chambers and valves are working. This procedure takes approximately one hour. There are no restrictions for this procedure.  Your physician has requested that you have en exercise stress myoview. For further information please visit [email or web address]. Please follow instruction sheet, as given.  Your physician has requested that you have a renal artery duplex. During this test, an ultrasound is used to evaluate blood flow to the kidneys. Allow one hour for this exam. Do not eat after midnight the day before and avoid carbonated beverages. Take your medications as you usually do.  Follow-Up: Your physician recommends that you schedule a follow-up appointment in: 1 month follow up with Dr. Tomie Chinaevankar   Any Other Special Instructions Will Be Listed Below (If Applicable).     If you need a refill on your cardiac medications before your next appointment, please call your pharmacy.

## 2017-04-18 NOTE — Progress Notes (Signed)
Cardiology Office Note:    Date:  04/18/2017   ID:  Jennifer Ayers, DOB Sep 03, 1975, MRN [id number]  PCP:  Bing NeighborsHarris, Kimberly S, FNP  Cardiologist:  Garwin Brothersajan R Revankar, MD   Referring MD: Bing NeighborsHarris, Kimberly S, FNP    ASSESSMENT:    1. Dyspnea on exertion   2. Hypertension, unspecified type   3. Accelerated hypertension   4. Chest wall pain    PLAN:    In order of problems listed above:  1. Primary prevention stressed with the patient.  Importance of compliance with diet and medications stressed and she vocalized understanding.  Still modification for essential hypertension was discussed. 2. Diet was discussed for essential hypertension and dyslipidemia.  We will check these and she will need to go back on atorvastatin.  She is to take it recently but quit by herself for no particular reason. 3. Risks of obesity explained and weight reduction was stressed and diet was detailed. 4. I spent 5 minutes with the patient discussing solely about smoking. Smoking cessation was counseled. I suggested to the patient also different medications and pharmacological interventions. Patient is keen to try stopping on its own at this time. He will get back to me if he needs any further assistance in this matter. 5. Echocardiogram will be done to assess murmur heard on auscultation.  It would also help me assess for any endorgan damage from essential hypertension. 6. Renal insufficiency and precautions were discussed with her at length. 7. She will undergo exercise stress Cardiolite to assess for any objective evidence of coronary artery disease in view of significant family history of coronary artery disease. 8. To assess for any renal arterial stenosis in a young female with significant elevation blood pressure. 9. Patient will be seen in follow-up appointment in 1 months or earlier if the patient has any concerns    Medication Adjustments/Labs and Tests Ordered: Current medicines are reviewed at length  with the patient today.  Concerns regarding medicines are outlined above.  Orders Placed This Encounter  Procedures  . Basic Metabolic Panel (BMET)  . Magnesium  . CBC  . TSH  . Hepatic function panel  . Lipid panel  . Pregnancy, urine  . Myocardial Perfusion Imaging  . ECHOCARDIOGRAM COMPLETE   No orders of the defined types were placed in this encounter.    History of Present Illness:    Jennifer Ayers is a 42 y.o. female who is being seen today for the evaluation of uncontrolled hypertension and dyspnea on exertion at the request of Bing NeighborsHarris, Kimberly S, FNP.  Patient is a pleasant 42 year old female.  She has past medical history of essential hypertension, dyslipidemia, obesity and is an active cigarette smoker she has mild renal insufficiency also.  She has been referred here for elevated blood pressure which is a challenge to control.  She leads a sedentary lifestyle.  She is overweight.  She complains of some shortness of breath on exertion.  No chest pain orthopnea or PND.  No history of syncope.  At the time of my evaluation, the patient is alert awake oriented and in no distress.  Past Medical History:  Diagnosis Date  . Hypertension     Past Surgical History:  Procedure Laterality Date  . CESAREAN SECTION    . TONSILLECTOMY      Current Medications: Current Meds  Medication Sig  . acetaminophen (TYLENOL) 500 MG tablet Take 1 tablet (500 mg total) by mouth every 6 (six) hours as needed.  Marland Kitchen. aspirin  EC 81 MG tablet Take 1 tablet (81 mg total) by mouth daily.  Marland Kitchen atorvastatin (LIPITOR) 20 MG tablet Take 1 tablet (20 mg total) by mouth daily.  . Blood Pressure Monitoring (BLOOD PRESSURE CUFF) MISC Take blood pressure once daily and notify your provider if blood pressure is greater than 160/90  . cetirizine (ZYRTEC) 10 MG tablet Take 1 tablet (10 mg total) by mouth daily.  . cloNIDine (CATAPRES - DOSED IN MG/24 HR) 0.1 mg/24hr patch Place 1 patch (0.1 mg total) onto the  skin once a week.  . etonogestrel (NEXPLANON) 68 MG IMPL implant 1 each by Subdermal route once.  . hydrochlorothiazide (HYDRODIURIL) 25 MG tablet Take 1 tablet (25 mg total) by mouth daily.  Marland Kitchen lisinopril (PRINIVIL,ZESTRIL) 40 MG tablet Take 1 tablet (40 mg total) by mouth daily.  . meloxicam (MOBIC) 15 MG tablet TAKE 2 TABLETS BY MOUTH DAILY.  . [DISCONTINUED] olopatadine (PATANOL) 0.1 % ophthalmic solution Place 1 drop into both eyes 2 (two) times daily.  . [DISCONTINUED] predniSONE (DELTASONE) 20 MG tablet Take 2 tablets (40 mg total) by mouth daily with breakfast.     Allergies:   Amlodipine   Social History   Socioeconomic History  . Marital status: Single    Spouse name: None  . Number of children: None  . Years of education: None  . Highest education level: None  Social Needs  . Financial resource strain: None  . Food insecurity - worry: None  . Food insecurity - inability: None  . Transportation needs - medical: None  . Transportation needs - non-medical: None  Occupational History  . None  Tobacco Use  . Smoking status: Current Every Day Smoker    Years: 5.00    Types: Cigarettes  . Smokeless tobacco: Never Used  Substance and Sexual Activity  . Alcohol use: No  . Drug use: No  . Sexual activity: Yes    Birth control/protection: None  Other Topics Concern  . None  Social History Narrative  . None     Family History: The patient's family history includes Diabetes in her father and mother; Hypertension in her father and mother.  ROS:   Please see the history of present illness.    All other systems reviewed and are negative.  EKGs/Labs/Other Studies Reviewed:    The following studies were reviewed today: EKG reveals sinus rhythm and nonspecific ST-T changes and evidence of left ventricular hypertrophy.   Recent Labs: 07/06/2016: Hemoglobin 14.8; Platelets 291; TSH 0.62 11/18/2016: ALT 10; BUN 14; Creat 1.29; Potassium 4.1; Sodium 139  Recent Lipid  Panel    Component Value Date/Time   CHOL 226 (H) 07/06/2016 1458   TRIG 138 07/06/2016 1458   HDL 35 (L) 07/06/2016 1458   CHOLHDL 6.5 (H) 07/06/2016 1458   VLDL 28 07/06/2016 1458   LDLCALC 163 (H) 07/06/2016 1458    Physical Exam:    VS:  BP 134/82 (BP Location: Left Arm, Patient Position: Sitting, Cuff Size: Normal)   Pulse 93   Ht 5\' 1"  (1.549 m)   Wt 188 lb (85.3 kg)   SpO2 98%   BMI 35.52 kg/m     Wt Readings from Last 3 Encounters:  04/18/17 188 lb (85.3 kg)  03/15/17 192 lb 3.2 oz (87.2 kg)  02/25/17 192 lb (87.1 kg)     GEN: Patient is in no acute distress HEENT: Normal NECK: No JVD; No carotid bruits LYMPHATICS: No lymphadenopathy CARDIAC: S1 S2 regular, 2/6 systolic murmur at  the apex. RESPIRATORY:  Clear to auscultation without rales, wheezing or rhonchi  ABDOMEN: Soft, non-tender, non-distended MUSCULOSKELETAL:  No edema; No deformity  SKIN: Warm and dry NEUROLOGIC:  Alert and oriented x 3 PSYCHIATRIC:  Normal affect    Signed, Garwin Brothers, MD  04/18/2017 11:16 AM    [facility]

## 2017-04-19 ENCOUNTER — Telehealth: Payer: Self-pay | Admitting: Cardiology

## 2017-04-19 LAB — CBC
HEMOGLOBIN: 15.3 g/dL (ref 11.1–15.9)
Hematocrit: 46.6 % (ref 34.0–46.6)
MCH: 30.2 pg (ref 26.6–33.0)
MCHC: 32.8 g/dL (ref 31.5–35.7)
MCV: 92 fL (ref 79–97)
Platelets: 304 10*3/uL (ref 150–379)
RBC: 5.06 x10E6/uL (ref 3.77–5.28)
RDW: 14.7 % (ref 12.3–15.4)
WBC: 7.8 10*3/uL (ref 3.4–10.8)

## 2017-04-19 LAB — HEPATIC FUNCTION PANEL
ALBUMIN: 4.4 g/dL (ref 3.5–5.5)
ALT: 14 IU/L (ref 0–32)
AST: 11 IU/L (ref 0–40)
Alkaline Phosphatase: 79 IU/L (ref 39–117)
Bilirubin Total: 0.5 mg/dL (ref 0.0–1.2)
Bilirubin, Direct: 0.1 mg/dL (ref 0.00–0.40)
TOTAL PROTEIN: 7.4 g/dL (ref 6.0–8.5)

## 2017-04-19 LAB — LIPID PANEL
Chol/HDL Ratio: 4.9 ratio — ABNORMAL HIGH (ref 0.0–4.4)
Cholesterol, Total: 196 mg/dL (ref 100–199)
HDL: 40 mg/dL (ref >39)
LDL Calculated: 132 mg/dL — ABNORMAL HIGH (ref 0–99)
Triglycerides: 122 mg/dL (ref 0–149)
VLDL CHOLESTEROL CAL: 24 mg/dL (ref 5–40)

## 2017-04-19 LAB — BASIC METABOLIC PANEL
BUN / CREAT RATIO: 13 (ref 9–23)
BUN: 16 mg/dL (ref 6–24)
CO2: 23 mmol/L (ref 20–29)
Calcium: 9.7 mg/dL (ref 8.7–10.2)
Chloride: 103 mmol/L (ref 96–106)
Creatinine, Ser: 1.27 mg/dL — ABNORMAL HIGH (ref 0.57–1.00)
GFR calc non Af Amer: 53 mL/min/{1.73_m2} — ABNORMAL LOW (ref >59)
GFR, EST AFRICAN AMERICAN: 61 mL/min/{1.73_m2} (ref >59)
GLUCOSE: 93 mg/dL (ref 65–99)
Potassium: 4.4 mmol/L (ref 3.5–5.2)
SODIUM: 142 mmol/L (ref 134–144)

## 2017-04-19 LAB — SPECIMEN STATUS REPORT

## 2017-04-19 LAB — TSH: TSH: 1.41 u[IU]/mL (ref 0.450–4.500)

## 2017-04-19 LAB — MAGNESIUM: MAGNESIUM: 2.2 mg/dL (ref 1.6–2.3)

## 2017-04-19 LAB — PREGNANCY, URINE: Preg Test, Ur: NEGATIVE

## 2017-04-19 MED FILL — cloNIDine 0.1 MG/24HR PTWK: 0.1 | 28 days supply | Qty: 4 | Fill #1

## 2017-04-19 NOTE — Telephone Encounter (Signed)
Please call her back, no info given

## 2017-04-19 NOTE — Addendum Note (Signed)
Addended by: Craige CottaANDERSON, ASHLEY S on: 04/19/2017 04:37 PM   Modules accepted: Orders

## 2017-04-19 NOTE — Telephone Encounter (Signed)
Called back

## 2017-04-21 ENCOUNTER — Telehealth (HOSPITAL_COMMUNITY): Payer: Self-pay | Admitting: *Deleted

## 2017-04-21 NOTE — Telephone Encounter (Signed)
Left message on voicemail per DPR in reference to upcoming appointment scheduled on 04/25/17 with detailed instructions given per Myocardial Perfusion Study Information Sheet for the test. LM to arrive 15 minutes early, and that it is imperative to arrive on time for appointment to keep from having the test rescheduled. If you need to cancel or reschedule your appointment, please call the office within 24 hours of your appointment. Failure to do so may result in a cancellation of your appointment, and a $50 no show fee. Phone number given for call back for any questions. Torez Beauregard Jacqueline   

## 2017-04-25 ENCOUNTER — Ambulatory Visit (HOSPITAL_BASED_OUTPATIENT_CLINIC_OR_DEPARTMENT_OTHER): Payer: Self-pay

## 2017-04-25 ENCOUNTER — Ambulatory Visit (HOSPITAL_COMMUNITY): Payer: No Typology Code available for payment source | Attending: Family Medicine

## 2017-04-25 VITALS — Ht 61.0 in | Wt 188.0 lb

## 2017-04-25 DIAGNOSIS — R011 Cardiac murmur, unspecified: Secondary | ICD-10-CM | POA: Insufficient documentation

## 2017-04-25 DIAGNOSIS — I10 Essential (primary) hypertension: Secondary | ICD-10-CM

## 2017-04-25 DIAGNOSIS — R0609 Other forms of dyspnea: Secondary | ICD-10-CM | POA: Insufficient documentation

## 2017-04-25 DIAGNOSIS — I119 Hypertensive heart disease without heart failure: Secondary | ICD-10-CM | POA: Insufficient documentation

## 2017-04-25 DIAGNOSIS — R112 Nausea with vomiting, unspecified: Secondary | ICD-10-CM

## 2017-04-25 DIAGNOSIS — I351 Nonrheumatic aortic (valve) insufficiency: Secondary | ICD-10-CM | POA: Insufficient documentation

## 2017-04-25 MED ORDER — REGADENOSON 0.4 MG/5ML IV SOLN
0.4000 mg | Freq: Once | INTRAVENOUS | Status: AC
  Administered 2017-04-25: 0.4 mg via INTRAVENOUS

## 2017-04-25 MED ORDER — TECHNETIUM TC 99M TETROFOSMIN IV KIT
31.6000 | PACK | Freq: Once | INTRAVENOUS | Status: AC | PRN
  Administered 2017-04-25: 31.6 via INTRAVENOUS
  Filled 2017-04-25: qty 32

## 2017-04-25 MED ORDER — AMINOPHYLLINE 25 MG/ML IV SOLN
75.0000 mg | Freq: Once | INTRAVENOUS | Status: AC
Start: 2017-04-25 — End: 2017-04-25
  Administered 2017-04-25: 75 mg via INTRAVENOUS

## 2017-04-25 MED ORDER — TECHNETIUM TC 99M TETROFOSMIN IV KIT
11.0000 | PACK | Freq: Once | INTRAVENOUS | Status: AC | PRN
  Administered 2017-04-25: 11 via INTRAVENOUS
  Filled 2017-04-25: qty 11

## 2017-04-26 ENCOUNTER — Encounter: Payer: Self-pay | Admitting: Cardiology

## 2017-04-26 ENCOUNTER — Ambulatory Visit (HOSPITAL_BASED_OUTPATIENT_CLINIC_OR_DEPARTMENT_OTHER)
Admission: RE | Admit: 2017-04-26 | Discharge: 2017-04-26 | Disposition: A | Discharge status: Home / Self Care | Payer: No Typology Code available for payment source | Source: Ambulatory Visit | Attending: Cardiology | Admitting: Cardiology

## 2017-04-26 ENCOUNTER — Ambulatory Visit (INDEPENDENT_AMBULATORY_CARE_PROVIDER_SITE_OTHER): Payer: No Typology Code available for payment source | Admitting: Cardiology

## 2017-04-26 VITALS — BP 130/82 | HR 54 | Ht 61.0 in | Wt 182.0 lb

## 2017-04-26 DIAGNOSIS — R0609 Other forms of dyspnea: Secondary | ICD-10-CM

## 2017-04-26 DIAGNOSIS — I10 Essential (primary) hypertension: Secondary | ICD-10-CM

## 2017-04-26 DIAGNOSIS — I209 Angina pectoris, unspecified: Secondary | ICD-10-CM | POA: Insufficient documentation

## 2017-04-26 DIAGNOSIS — R918 Other nonspecific abnormal finding of lung field: Secondary | ICD-10-CM | POA: Insufficient documentation

## 2017-04-26 DIAGNOSIS — F17210 Nicotine dependence, cigarettes, uncomplicated: Secondary | ICD-10-CM | POA: Insufficient documentation

## 2017-04-26 DIAGNOSIS — F17290 Nicotine dependence, other tobacco product, uncomplicated: Secondary | ICD-10-CM | POA: Insufficient documentation

## 2017-04-26 LAB — MYOCARDIAL PERFUSION IMAGING
CHL CUP NUCLEAR SSS: 7
CHL CUP RESTING HR STRESS: 61 {beats}/min
LHR: 0.21
LV dias vol: 153 mL (ref 46–106)
LVSYSVOL: 99 mL
NUC STRESS TID: 1.06
Peak HR: 96 {beats}/min
SDS: 4
SRS: 3

## 2017-04-26 MED ORDER — NITROGLYCERIN 0.4 MG SL SUBL: 0.4000 mg | SUBLINGUAL_TABLET | SUBLINGUAL | 11 refills | Status: DC | PRN

## 2017-04-26 NOTE — Addendum Note (Signed)
Addended by: Ayesha MohairWELLS, Tabria Steines E on: 04/26/2017 05:02 PM   Modules accepted: Orders

## 2017-04-26 NOTE — Patient Instructions (Addendum)
Medication Instructions:  Your physician has recommended you make the following change in your medication:  START nitroglycerin 0.4 mg sublingual (under your tongue) every 5 minutes as needed for chest pain. When having chest pain, stop what you are doing and sit down. Take 1 nitro, wait 5 minutes. Still having chest pain, take 1 nitro, wait 5 minutes. Still having chest pain, take 1 nitro, dial 911. Total of 3 nitro in 15 minutes.  Labwork: Your physician recommends that you have the following labs drawn: INR  Testing/Procedures: A chest x-ray takes a picture of the organs and structures inside the chest, including the heart, lungs, and blood vessels. This test can show several things, including, whether the heart is enlarges; whether fluid is building up in the lungs; and whether pacemaker / defibrillator leads are still in place.    [facility] [address], [address] [place] [address] Dept: [phone] Loc: [phone]  Markus JarvisLablanch Lor  04/26/2017  You are scheduled for a Cardiac Catheterization on Thursday, February 21 with Dr. Lance MussJayadeep Varanasi.  1. [facility] at [facility] [place]: [address] [place], [place] [address] at 10:00 AM (two hours before your procedure to ensure your preparation). Free valet parking service is available.   Special note: Every effort is made to have your procedure done on time. Please understand that emergencies sometimes delay scheduled procedures.  2. Diet: Do not eat or drink anything after midnight prior to your procedure except sips of water to take medications.  3. Labs: Done today.  4. Medication instructions in preparation for your procedure:  On the morning of your procedure, take your Aspirin and any morning medicines NOT listed above.  You may use sips of water.  5. Plan for one night stay--bring personal  belongings. 6. Bring a current list of your medications and current insurance cards. 7. You MUST have a responsible person to drive you home. 8. Someone MUST be with you the first 24 hours after you arrive home or your discharge will be delayed. 9. Please wear clothes that are easy to get on and off and wear slip-on shoes.  Thank you for allowing us to care for you!   -- [facility]   Follow-Up: Your physician recommends that you schedule a follow-up appointment in: 4 weeks.  Any Other Special Instructions Will Be Listed Below (If Applicable).     If you need a refill on your cardiac medications before your next appointment, please call your pharmacy.   [facility]  Garey HamAshley A, RN, BSN

## 2017-04-26 NOTE — H&P (View-Only) (Signed)
Cardiology Office Note:    Date:  04/26/2017   ID:  Jennifer Ayers, DOB Jan 31, 1976, MRN [id number]  PCP:  Bing NeighborsHarris, Kimberly S, FNP  Cardiologist:  Garwin Brothersajan R Revankar, MD   Referring MD: Bing NeighborsHarris, Kimberly S, FNP    ASSESSMENT:    1. Accelerated hypertension   2. Essential hypertension   3. Dyspnea on exertion   4. Cigar smoker   5. Cigarette smoker   6. Angina pectoris (HCC)    PLAN:    In order of problems listed above:  1. I discussed my findings with the patient at extensive length.  Her symptoms are very concerning.  I discussed coronary angiography and left heart catheterization with the patient at extensive length. Procedure, benefits and potential risks were explained. Patient had multiple questions which were answered to the patient's satisfaction. Patient agreed and consented for the procedure. Further recommendations will be made based on the findings of the coronary angiography. In the interim. The patient has any significant symptoms he knows to go to the nearest emergency room. 2. I spent 5 minutes with the patient discussing solely about smoking. Smoking cessation was counseled. I suggested to the patient also different medications and pharmacological interventions. Patient is keen to try stopping on its own at this time. He will get back to me if he needs any further assistance in this matter. 3. Sublingual nitroglycerin prescription was sent, its protocol and 911 protocol explained and the patient vocalized understanding questions were answered to the patient's satisfaction. 4. I will leave it up to the discretion of my interventional colleagues to decide whether she will need left ventriculogram based on the amount of dye the use in this patient.  It is to be noted that she has mild renal insufficiency.   Medication Adjustments/Labs and Tests Ordered: Current medicines are reviewed at length with the patient today.  Concerns regarding medicines are outlined above.  Orders  Placed This Encounter  Procedures  . DG Chest 2 View  . INR/PT   No orders of the defined types were placed in this encounter.    Chief Complaint  Patient presents with  . Follow-up     History of Present Illness:    Jennifer Ayers is a 42 y.o. female.  The patient was evaluated for chest tightness and dyspnea on exertion symptoms.  She has multiple risk factors for coronary artery disease and underwent stress testing and echocardiogram testing.  Both of these were abnormal and revealed significant degree of cardiomyopathy.  For this reason the patient was brought back to discuss these results.  She now says that she has chest tightness on exertion and this limits her activity and the symptoms are very concerning.  At the time of my evaluation, the patient is alert awake oriented and in no distress.  Past Medical History:  Diagnosis Date  . Hypertension     Past Surgical History:  Procedure Laterality Date  . CESAREAN SECTION    . TONSILLECTOMY      Current Medications: Current Meds  Medication Sig  . acetaminophen (TYLENOL) 500 MG tablet Take 1 tablet (500 mg total) by mouth every 6 (six) hours as needed.  Marland Kitchen. aspirin EC 81 MG tablet Take 1 tablet (81 mg total) by mouth daily.  Marland Kitchen. atorvastatin (LIPITOR) 20 MG tablet Take 1 tablet (20 mg total) by mouth daily.  . Blood Pressure Monitoring (BLOOD PRESSURE CUFF) MISC Take blood pressure once daily and notify your provider if blood pressure is greater than 160/90  .  cetirizine (ZYRTEC) 10 MG tablet Take 1 tablet (10 mg total) by mouth daily.  . cloNIDine (CATAPRES - DOSED IN MG/24 HR) 0.1 mg/24hr patch Place 1 patch (0.1 mg total) onto the skin once a week.  . cloNIDine (CATAPRES - DOSED IN MG/24 HR) 0.1 mg/24hr patch PLACE 1 PATCH  0 1 MG TOTAL  ONTO THE SKIN ONCE A WEEK  . etonogestrel (NEXPLANON) 68 MG IMPL implant 1 each by Subdermal route once.  . hydrochlorothiazide (HYDRODIURIL) 25 MG tablet Take 1 tablet (25 mg total) by  mouth daily.  Marland Kitchen lisinopril (PRINIVIL,ZESTRIL) 40 MG tablet Take 1 tablet (40 mg total) by mouth daily.  . meloxicam (MOBIC) 15 MG tablet TAKE 2 TABLETS BY MOUTH DAILY.     Allergies:   Amlodipine   Social History   Socioeconomic History  . Marital status: Single    Spouse name: None  . Number of children: None  . Years of education: None  . Highest education level: None  Social Needs  . Financial resource strain: None  . Food insecurity - worry: None  . Food insecurity - inability: None  . Transportation needs - medical: None  . Transportation needs - non-medical: None  Occupational History  . None  Tobacco Use  . Smoking status: Current Every Day Smoker    Years: 5.00    Types: Cigarettes  . Smokeless tobacco: Never Used  Substance and Sexual Activity  . Alcohol use: No  . Drug use: No  . Sexual activity: Yes    Birth control/protection: None  Other Topics Concern  . None  Social History Narrative  . None     Family History: The patient's family history includes Diabetes in her father and mother; Hypertension in her father and mother.  ROS:   Please see the history of present illness.    All other systems reviewed and are negative.  EKGs/Labs/Other Studies Reviewed:    The following studies were reviewed today: Stress test and echocardiogram report with the patient at extensive length   Recent Labs: 04/18/2017: ALT 14; BUN 16; Creatinine, Ser 1.27; Hemoglobin 15.3; Magnesium 2.2; Platelets 304; Potassium 4.4; Sodium 142; TSH 1.410  Recent Lipid Panel    Component Value Date/Time   CHOL 196 04/18/2017 0000   TRIG 122 04/18/2017 0000   HDL 40 04/18/2017 0000   CHOLHDL 4.9 (H) 04/18/2017 0000   CHOLHDL 6.5 (H) 07/06/2016 1458   VLDL 28 07/06/2016 1458   LDLCALC 132 (H) 04/18/2017 0000    Physical Exam:    VS:  BP 130/82 (BP Location: Right Arm, Patient Position: Sitting, Cuff Size: Normal)   Pulse (!) 54   Ht 5\' 1"  (1.549 m)   Wt 182 lb (82.6 kg)    SpO2 99%   BMI 34.39 kg/m     Wt Readings from Last 3 Encounters:  04/26/17 182 lb (82.6 kg)  04/25/17 188 lb (85.3 kg)  04/18/17 188 lb (85.3 kg)     GEN: Patient is in no acute distress HEENT: Normal NECK: No JVD; No carotid bruits LYMPHATICS: No lymphadenopathy CARDIAC: Hear sounds regular, 2/6 systolic murmur at the apex. RESPIRATORY:  Clear to auscultation without rales, wheezing or rhonchi  ABDOMEN: Soft, non-tender, non-distended MUSCULOSKELETAL:  No edema; No deformity  SKIN: Warm and dry NEUROLOGIC:  Alert and oriented x 3 PSYCHIATRIC:  Normal affect   Signed, Garwin Brothers, MD  04/26/2017 4:51 PM    [facility]

## 2017-04-26 NOTE — Progress Notes (Signed)
Cardiology Office Note:    Date:  04/26/2017   ID:  Jennifer Ayers, DOB Jan 31, 1976, MRN [id number]  PCP:  Bing NeighborsHarris, Kimberly S, FNP  Cardiologist:  Garwin Brothersajan R Rajohn Henery, MD   Referring MD: Bing NeighborsHarris, Kimberly S, FNP    ASSESSMENT:    1. Accelerated hypertension   2. Essential hypertension   3. Dyspnea on exertion   4. Cigar smoker   5. Cigarette smoker   6. Angina pectoris (HCC)    PLAN:    In order of problems listed above:  1. I discussed my findings with the patient at extensive length.  Her symptoms are very concerning.  I discussed coronary angiography and left heart catheterization with the patient at extensive length. Procedure, benefits and potential risks were explained. Patient had multiple questions which were answered to the patient's satisfaction. Patient agreed and consented for the procedure. Further recommendations will be made based on the findings of the coronary angiography. In the interim. The patient has any significant symptoms he knows to go to the nearest emergency room. 2. I spent 5 minutes with the patient discussing solely about smoking. Smoking cessation was counseled. I suggested to the patient also different medications and pharmacological interventions. Patient is keen to try stopping on its own at this time. He will get back to me if he needs any further assistance in this matter. 3. Sublingual nitroglycerin prescription was sent, its protocol and 911 protocol explained and the patient vocalized understanding questions were answered to the patient's satisfaction. 4. I will leave it up to the discretion of my interventional colleagues to decide whether she will need left ventriculogram based on the amount of dye the use in this patient.  It is to be noted that she has mild renal insufficiency.   Medication Adjustments/Labs and Tests Ordered: Current medicines are reviewed at length with the patient today.  Concerns regarding medicines are outlined above.  Orders  Placed This Encounter  Procedures  . DG Chest 2 View  . INR/PT   No orders of the defined types were placed in this encounter.    Chief Complaint  Patient presents with  . Follow-up     History of Present Illness:    Jennifer Ayers is a 42 y.o. female.  The patient was evaluated for chest tightness and dyspnea on exertion symptoms.  She has multiple risk factors for coronary artery disease and underwent stress testing and echocardiogram testing.  Both of these were abnormal and revealed significant degree of cardiomyopathy.  For this reason the patient was brought back to discuss these results.  She now says that she has chest tightness on exertion and this limits her activity and the symptoms are very concerning.  At the time of my evaluation, the patient is alert awake oriented and in no distress.  Past Medical History:  Diagnosis Date  . Hypertension     Past Surgical History:  Procedure Laterality Date  . CESAREAN SECTION    . TONSILLECTOMY      Current Medications: Current Meds  Medication Sig  . acetaminophen (TYLENOL) 500 MG tablet Take 1 tablet (500 mg total) by mouth every 6 (six) hours as needed.  Marland Kitchen. aspirin EC 81 MG tablet Take 1 tablet (81 mg total) by mouth daily.  Marland Kitchen. atorvastatin (LIPITOR) 20 MG tablet Take 1 tablet (20 mg total) by mouth daily.  . Blood Pressure Monitoring (BLOOD PRESSURE CUFF) MISC Take blood pressure once daily and notify your provider if blood pressure is greater than 160/90  .  cetirizine (ZYRTEC) 10 MG tablet Take 1 tablet (10 mg total) by mouth daily.  . cloNIDine (CATAPRES - DOSED IN MG/24 HR) 0.1 mg/24hr patch Place 1 patch (0.1 mg total) onto the skin once a week.  . cloNIDine (CATAPRES - DOSED IN MG/24 HR) 0.1 mg/24hr patch PLACE 1 PATCH  0 1 MG TOTAL  ONTO THE SKIN ONCE A WEEK  . etonogestrel (NEXPLANON) 68 MG IMPL implant 1 each by Subdermal route once.  . hydrochlorothiazide (HYDRODIURIL) 25 MG tablet Take 1 tablet (25 mg total) by  mouth daily.  Marland Kitchen lisinopril (PRINIVIL,ZESTRIL) 40 MG tablet Take 1 tablet (40 mg total) by mouth daily.  . meloxicam (MOBIC) 15 MG tablet TAKE 2 TABLETS BY MOUTH DAILY.     Allergies:   Amlodipine   Social History   Socioeconomic History  . Marital status: Single    Spouse name: None  . Number of children: None  . Years of education: None  . Highest education level: None  Social Needs  . Financial resource strain: None  . Food insecurity - worry: None  . Food insecurity - inability: None  . Transportation needs - medical: None  . Transportation needs - non-medical: None  Occupational History  . None  Tobacco Use  . Smoking status: Current Every Day Smoker    Years: 5.00    Types: Cigarettes  . Smokeless tobacco: Never Used  Substance and Sexual Activity  . Alcohol use: No  . Drug use: No  . Sexual activity: Yes    Birth control/protection: None  Other Topics Concern  . None  Social History Narrative  . None     Family History: The patient's family history includes Diabetes in her father and mother; Hypertension in her father and mother.  ROS:   Please see the history of present illness.    All other systems reviewed and are negative.  EKGs/Labs/Other Studies Reviewed:    The following studies were reviewed today: Stress test and echocardiogram report with the patient at extensive length   Recent Labs: 04/18/2017: ALT 14; BUN 16; Creatinine, Ser 1.27; Hemoglobin 15.3; Magnesium 2.2; Platelets 304; Potassium 4.4; Sodium 142; TSH 1.410  Recent Lipid Panel    Component Value Date/Time   CHOL 196 04/18/2017 0000   TRIG 122 04/18/2017 0000   HDL 40 04/18/2017 0000   CHOLHDL 4.9 (H) 04/18/2017 0000   CHOLHDL 6.5 (H) 07/06/2016 1458   VLDL 28 07/06/2016 1458   LDLCALC 132 (H) 04/18/2017 0000    Physical Exam:    VS:  BP 130/82 (BP Location: Right Arm, Patient Position: Sitting, Cuff Size: Normal)   Pulse (!) 54   Ht 5\' 1"  (1.549 m)   Wt 182 lb (82.6 kg)    SpO2 99%   BMI 34.39 kg/m     Wt Readings from Last 3 Encounters:  04/26/17 182 lb (82.6 kg)  04/25/17 188 lb (85.3 kg)  04/18/17 188 lb (85.3 kg)     GEN: Patient is in no acute distress HEENT: Normal NECK: No JVD; No carotid bruits LYMPHATICS: No lymphadenopathy CARDIAC: Hear sounds regular, 2/6 systolic murmur at the apex. RESPIRATORY:  Clear to auscultation without rales, wheezing or rhonchi  ABDOMEN: Soft, non-tender, non-distended MUSCULOSKELETAL:  No edema; No deformity  SKIN: Warm and dry NEUROLOGIC:  Alert and oriented x 3 PSYCHIATRIC:  Normal affect   Signed, Garwin Brothers, MD  04/26/2017 4:51 PM    [facility]

## 2017-04-27 LAB — PROTIME-INR
INR: 1 (ref 0.8–1.2)
Prothrombin Time: 10.6 s (ref 9.1–12.0)

## 2017-04-28 ENCOUNTER — Ambulatory Visit (HOSPITAL_COMMUNITY)
Admission: RE | Disposition: A | Discharge status: Home / Self Care | Payer: Self-pay | Source: Ambulatory Visit | Attending: Interventional Cardiology

## 2017-04-28 ENCOUNTER — Other Ambulatory Visit: Payer: Self-pay

## 2017-04-28 ENCOUNTER — Ambulatory Visit (HOSPITAL_COMMUNITY)
Admission: RE | Admit: 2017-04-28 | Discharge: 2017-04-28 | Disposition: A | Discharge status: Home / Self Care | Payer: No Typology Code available for payment source | Source: Ambulatory Visit | Attending: Interventional Cardiology | Admitting: Interventional Cardiology

## 2017-04-28 DIAGNOSIS — I2510 Atherosclerotic heart disease of native coronary artery without angina pectoris: Secondary | ICD-10-CM | POA: Insufficient documentation

## 2017-04-28 DIAGNOSIS — Z7982 Long term (current) use of aspirin: Secondary | ICD-10-CM | POA: Insufficient documentation

## 2017-04-28 DIAGNOSIS — F17210 Nicotine dependence, cigarettes, uncomplicated: Secondary | ICD-10-CM | POA: Insufficient documentation

## 2017-04-28 DIAGNOSIS — Z79899 Other long term (current) drug therapy: Secondary | ICD-10-CM | POA: Insufficient documentation

## 2017-04-28 DIAGNOSIS — R072 Precordial pain: Secondary | ICD-10-CM

## 2017-04-28 DIAGNOSIS — I10 Essential (primary) hypertension: Secondary | ICD-10-CM | POA: Insufficient documentation

## 2017-04-28 DIAGNOSIS — Z888 Allergy status to other drugs, medicaments and biological substances status: Secondary | ICD-10-CM | POA: Insufficient documentation

## 2017-04-28 HISTORY — PX: LEFT HEART CATH AND CORONARY ANGIOGRAPHY: CATH118249

## 2017-04-28 LAB — BASIC METABOLIC PANEL
Anion gap: 14 (ref 5–15)
BUN: 18 mg/dL (ref 6–20)
CO2: 21 mmol/L — ABNORMAL LOW (ref 22–32)
CREATININE: 1.31 mg/dL — AB (ref 0.44–1.00)
Calcium: 9.3 mg/dL (ref 8.9–10.3)
Chloride: 104 mmol/L (ref 101–111)
GFR calc Af Amer: 58 mL/min — ABNORMAL LOW (ref >60)
GFR calc non Af Amer: 50 mL/min — ABNORMAL LOW (ref >60)
Glucose, Bld: 75 mg/dL (ref 65–99)
Potassium: 3.6 mmol/L (ref 3.5–5.1)
SODIUM: 139 mmol/L (ref 135–145)

## 2017-04-28 LAB — CBC WITH DIFFERENTIAL/PLATELET
BASOS PCT: 0 %
Basophils Absolute: 0 10*3/uL (ref 0.0–0.1)
EOS PCT: 0 %
Eosinophils Absolute: 0 10*3/uL (ref 0.0–0.7)
HCT: 48.3 % — ABNORMAL HIGH (ref 36.0–46.0)
HEMOGLOBIN: 15.9 g/dL — AB (ref 12.0–15.0)
Lymphocytes Relative: 16 %
Lymphs Abs: 1.3 10*3/uL (ref 0.7–4.0)
MCH: 30.6 pg (ref 26.0–34.0)
MCHC: 32.9 g/dL (ref 30.0–36.0)
MCV: 93.1 fL (ref 78.0–100.0)
MONOS PCT: 2 %
Monocytes Absolute: 0.2 10*3/uL (ref 0.1–1.0)
NEUTROS PCT: 82 %
Neutro Abs: 6.7 10*3/uL (ref 1.7–7.7)
PLATELETS: 281 10*3/uL (ref 150–400)
RBC: 5.19 MIL/uL — AB (ref 3.87–5.11)
RDW: 13.9 % (ref 11.5–15.5)
WBC: 8.1 10*3/uL (ref 4.0–10.5)

## 2017-04-28 LAB — GLUCOSE, CAPILLARY
GLUCOSE-CAPILLARY: 29 mg/dL — AB (ref 65–99)
Glucose-Capillary: 36 mg/dL — CL (ref 65–99)
Glucose-Capillary: 11 mg/dL — CL (ref 65–99)
Glucose-Capillary: 126 mg/dL — ABNORMAL HIGH (ref 65–99)
Glucose-Capillary: 68 mg/dL (ref 65–99)
Glucose-Capillary: 36 mg/dL — CL (ref 65–99)
Glucose-Capillary: 50 mg/dL — ABNORMAL LOW (ref 65–99)

## 2017-04-28 LAB — PREGNANCY, URINE: PREG TEST UR: NEGATIVE

## 2017-04-28 SURGERY — LEFT HEART CATH AND CORONARY ANGIOGRAPHY
Anesthesia: LOCAL

## 2017-04-28 MED ORDER — VERAPAMIL HCL 2.5 MG/ML IV SOLN
INTRAVENOUS | Status: AC
  Filled 2017-04-28: qty 2

## 2017-04-28 MED ORDER — FENTANYL CITRATE (PF) 100 MCG/2ML IJ SOLN
INTRAMUSCULAR | Status: AC
  Filled 2017-04-28: qty 2

## 2017-04-28 MED ORDER — MIDAZOLAM HCL 2 MG/2ML IJ SOLN
INTRAMUSCULAR | Status: AC
  Filled 2017-04-28: qty 2

## 2017-04-28 MED ORDER — DEXTROSE 50 % IV SOLN
1.0000 | Freq: Once | INTRAVENOUS | Status: AC
  Administered 2017-04-28: 50 mL via INTRAVENOUS

## 2017-04-28 MED ORDER — FENTANYL CITRATE (PF) 100 MCG/2ML IJ SOLN
INTRAMUSCULAR | Status: DC | PRN
  Administered 2017-04-28: 25 ug via INTRAVENOUS
  Administered 2017-04-28: 50 ug via INTRAVENOUS

## 2017-04-28 MED ORDER — SODIUM CHLORIDE 0.9 % WEIGHT BASED INFUSION
1.0000 mL/kg/h | INTRAVENOUS | Status: DC
  Administered 2017-04-28 (×2): 250 mL via INTRAVENOUS

## 2017-04-28 MED ORDER — HEPARIN (PORCINE) IN NACL 2-0.9 UNIT/ML-% IJ SOLN
INTRAMUSCULAR | Status: AC | PRN
  Administered 2017-04-28: 1000 mL

## 2017-04-28 MED ORDER — SODIUM CHLORIDE 0.9 % IV SOLN: 250.0000 mL | INTRAVENOUS | Status: DC | PRN

## 2017-04-28 MED ORDER — HYDRALAZINE HCL 20 MG/ML IJ SOLN
INTRAMUSCULAR | Status: DC | PRN
  Administered 2017-04-28: 20 mg via INTRAVENOUS

## 2017-04-28 MED ORDER — ASPIRIN 81 MG PO CHEW: 81.0000 mg | CHEWABLE_TABLET | ORAL | Status: DC

## 2017-04-28 MED ORDER — LIDOCAINE HCL (PF) 1 % IJ SOLN
INTRAMUSCULAR | Status: AC
  Filled 2017-04-28: qty 30

## 2017-04-28 MED ORDER — IOPAMIDOL (ISOVUE-370) INJECTION 76%
INTRAVENOUS | Status: AC
  Filled 2017-04-28: qty 150

## 2017-04-28 MED ORDER — HEPARIN SODIUM (PORCINE) 1000 UNIT/ML IJ SOLN
INTRAMUSCULAR | Status: AC
  Filled 2017-04-28: qty 1

## 2017-04-28 MED ORDER — SODIUM CHLORIDE 0.9% FLUSH: 3.0000 mL | INTRAVENOUS | Status: DC | PRN

## 2017-04-28 MED ORDER — SODIUM CHLORIDE 0.9% FLUSH: 3.0000 mL | Freq: Two times a day (BID) | INTRAVENOUS | Status: DC

## 2017-04-28 MED ORDER — ONDANSETRON HCL 4 MG/2ML IJ SOLN
4.0000 mg | Freq: Once | INTRAMUSCULAR | Status: AC
  Administered 2017-04-28: 4 mg via INTRAVENOUS

## 2017-04-28 MED ORDER — HEPARIN (PORCINE) IN NACL 2-0.9 UNIT/ML-% IJ SOLN
INTRAMUSCULAR | Status: AC
  Filled 2017-04-28: qty 1000

## 2017-04-28 MED ORDER — SODIUM CHLORIDE 0.9 % WEIGHT BASED INFUSION
3.0000 mL/kg/h | INTRAVENOUS | Status: AC
  Administered 2017-04-28: 3 mL/kg/h via INTRAVENOUS

## 2017-04-28 MED ORDER — ONDANSETRON HCL 4 MG/2ML IJ SOLN
INTRAMUSCULAR | Status: AC
  Filled 2017-04-28: qty 2

## 2017-04-28 MED ORDER — DEXTROSE 50 % IV SOLN
INTRAVENOUS | Status: AC
  Filled 2017-04-28: qty 50

## 2017-04-28 MED ORDER — HYDRALAZINE HCL 20 MG/ML IJ SOLN
INTRAMUSCULAR | Status: AC
  Filled 2017-04-28: qty 1

## 2017-04-28 MED ORDER — MIDAZOLAM HCL 2 MG/2ML IJ SOLN
INTRAMUSCULAR | Status: DC | PRN
  Administered 2017-04-28: 1 mg via INTRAVENOUS
  Administered 2017-04-28: 2 mg via INTRAVENOUS

## 2017-04-28 MED ORDER — ONDANSETRON HCL 4 MG/2ML IJ SOLN
4.0000 mg | Freq: Four times a day (QID) | INTRAMUSCULAR | Status: DC | PRN
  Administered 2017-04-28: 4 mg via INTRAVENOUS

## 2017-04-28 MED ORDER — LIDOCAINE HCL (PF) 1 % IJ SOLN
INTRAMUSCULAR | Status: DC | PRN
  Administered 2017-04-28: 15 mL
  Administered 2017-04-28: 2 mL

## 2017-04-28 MED ORDER — IOPAMIDOL (ISOVUE-370) INJECTION 76%
INTRAVENOUS | Status: DC | PRN
  Administered 2017-04-28: 40 mL via INTRA_ARTERIAL

## 2017-04-28 SURGICAL SUPPLY — 12 items
CATH INFINITI 5FR MULTPACK ANG (CATHETERS) ×2 IMPLANT
COVER PRB 48X5XTLSCP FOLD TPE (BAG) ×1 IMPLANT
COVER PROBE 5X48 (BAG) ×2
GLIDESHEATH SLEND SS 6F .021 (SHEATH) ×2 IMPLANT
GUIDEWIRE INQWIRE 1.5J.035X260 (WIRE) IMPLANT
INQWIRE 1.5J .035X260CM (WIRE)
KIT HEART LEFT (KITS) ×2 IMPLANT
PACK CARDIAC CATHETERIZATION (CUSTOM PROCEDURE TRAY) ×2 IMPLANT
SHEATH PINNACLE 5F 10CM (SHEATH) ×2 IMPLANT
TRANSDUCER W/STOPCOCK (MISCELLANEOUS) ×2 IMPLANT
TUBING CIL FLEX 10 FLL-RA (TUBING) ×2 IMPLANT
WIRE EMERALD 3MM-J .035X150CM (WIRE) ×2 IMPLANT

## 2017-04-28 NOTE — Interval H&P Note (Signed)
Cath Lab Visit (complete for each Cath Lab visit)  Clinical Evaluation Leading to the Procedure:   ACS: No.  Non-ACS:    Anginal Classification: CCS III  Anti-ischemic medical therapy: Minimal Therapy (1 class of medications)  Non-Invasive Test Results: Intermediate-risk stress test findings: cardiac mortality 1-3%/year  Prior CABG: No previous CABG      History and Physical Interval Note:  04/28/2017 12:12 PM  Jennifer Ayers  has presented today for surgery, with the diagnosis of angina  The various methods of treatment have been discussed with the patient and family. After consideration of risks, benefits and other options for treatment, the patient has consented to  Procedure(s): LEFT HEART CATH AND CORONARY ANGIOGRAPHY (N/A) as a surgical intervention .  The patient's history has been reviewed, patient examined, no change in status, stable for surgery.  I have reviewed the patient's chart and labs.  Questions were answered to the patient's satisfaction.     Lance MussJayadeep Varanasi

## 2017-04-28 NOTE — Progress Notes (Signed)
Jennifer Ayers's blood pressure continues to remain low. She remains in [place]. Blood sugar checked and found to be hypoglycemic at 36. Patient given coke and blood sugar re-checked

## 2017-04-28 NOTE — Progress Notes (Signed)
Catalina GravelJ. Varanasi notified of patient's blood pressure and orders for another 250 bolus of noraml saline initiated

## 2017-04-28 NOTE — Discharge Instructions (Signed)
Angiogram, Care After °This sheet gives you information about how to care for yourself after your procedure. Your health care provider may also give you more specific instructions. If you have problems or questions, contact your health care provider. °What can I expect after the procedure? °After the procedure, it is common to have bruising and tenderness at the catheter insertion area. °Follow these instructions at home: °Insertion site care °· Follow instructions from your health care provider about how to take care of your insertion site. Make sure you: °? Wash your hands with soap and water before you change your bandage (dressing). If soap and water are not available, use hand sanitizer. °? Change your dressing as told by your health care provider. °? Leave stitches (sutures), skin glue, or adhesive strips in place. These skin closures may need to stay in place for 2 weeks or longer. If adhesive strip edges start to loosen and curl up, you may trim the loose edges. Do not remove adhesive strips completely unless your health care provider tells you to do that. °· Do not take baths, swim, or use a hot tub until your health care provider approves. °· You may shower 24-48 hours after the procedure or as told by your health care provider. °? Gently wash the site with plain soap and water. °? Pat the area dry with a clean towel. °? Do not rub the site. This may cause bleeding. °· Do not apply powder or lotion to the site. Keep the site clean and dry. °· Check your insertion site every day for signs of infection. Check for: °? Redness, swelling, or pain. °? Fluid or blood. °? Warmth. °? Pus or a bad smell. °Activity °· Rest as told by your health care provider, usually for 1-2 days. °· Do not lift anything that is heavier than 10 lbs. (4.5 kg) or as told by your health care provider. °· Do not drive for 24 hours if you were given a medicine to help you relax (sedative). °· Do not drive or use heavy machinery while  taking prescription pain medicine. °General instructions °· Return to your normal activities as told by your health care provider, usually in about a week. Ask your health care provider what activities are safe for you. °· If the catheter site starts bleeding, lie flat and put pressure on the site. If the bleeding does not stop, get help right away. This is a medical emergency. °· Drink enough fluid to keep your urine clear or pale yellow. This helps flush the contrast dye from your body. °· Take over-the-counter and prescription medicines only as told by your health care provider. °· Keep all follow-up visits as told by your health care provider. This is important. °Contact a health care provider if: °· You have a fever or chills. °· You have redness, swelling, or pain around your insertion site. °· You have fluid or blood coming from your insertion site. °· The insertion site feels warm to the touch. °· You have pus or a bad smell coming from your insertion site. °· You have bruising around the insertion site. °· You notice blood collecting in the tissue around the catheter site (hematoma). The hematoma may be painful to the touch. °Get help right away if: °· You have severe pain at the catheter insertion area. °· The catheter insertion area swells very fast. °· The catheter insertion area is bleeding, and the bleeding does not stop when you hold steady pressure on the area. °·   The area near or just beyond the catheter insertion site becomes pale, cool, tingly, or numb. °These symptoms may represent a serious problem that is an emergency. Do not wait to see if the symptoms will go away. Get medical help right away. Call your local emergency services (911 in the [place] Do not drive yourself to the hospital. °Summary °· After the procedure, it is common to have bruising and tenderness at the catheter insertion area. °· After the procedure, it is important to rest and drink plenty of fluids. °· Do not take baths,  swim, or use a hot tub until your health care provider says it is okay to do so. You may shower 24-48 hours after the procedure or as told by your health care provider. °· If the catheter site starts bleeding, lie flat and put pressure on the site. If the bleeding does not stop, get help right away. This is a medical emergency. °This information is not intended to replace advice given to you by your health care provider. Make sure you discuss any questions you have with your health care provider. °Document Released: 09/10/2004 Document Revised: 01/28/2016 Document Reviewed: 01/28/2016 °Elsevier Interactive Patient Education © 2018 Elsevier Inc. °Moderate Conscious Sedation, Adult, Care After °These instructions provide you with information about caring for yourself after your procedure. Your health care provider may also give you more specific instructions. Your treatment has been planned according to current medical practices, but problems sometimes occur. Call your health care provider if you have any problems or questions after your procedure. °What can I expect after the procedure? °After your procedure, it is common: °· To feel sleepy for several hours. °· To feel clumsy and have poor balance for several hours. °· To have poor judgment for several hours. °· To vomit if you eat too soon. ° °Follow these instructions at home: °For at least 24 hours after the procedure: ° °· Do not: °? Participate in activities where you could fall or become injured. °? Drive. °? Use heavy machinery. °? Drink alcohol. °? Take sleeping pills or medicines that cause drowsiness. °? Make important decisions or sign legal documents. °? Take care of children on your own. °· Rest. °Eating and drinking °· Follow the diet recommended by your health care provider. °· If you vomit: °? Drink water, juice, or soup when you can drink without vomiting. °? Make sure you have little or no nausea before eating solid foods. °General  instructions °· Have a responsible adult stay with you until you are awake and alert. °· Take over-the-counter and prescription medicines only as told by your health care provider. °· If you smoke, do not smoke without supervision. °· Keep all follow-up visits as told by your health care provider. This is important. °Contact a health care provider if: °· You keep feeling nauseous or you keep vomiting. °· You feel light-headed. °· You develop a rash. °· You have a fever. °Get help right away if: °· You have trouble breathing. °This information is not intended to replace advice given to you by your health care provider. Make sure you discuss any questions you have with your health care provider. °Document Released: 12/13/2012 Document Revised: 07/28/2015 Document Reviewed: 06/14/2015 °Elsevier Interactive Patient Education © 2018 Elsevier Inc. ° °

## 2017-04-28 NOTE — Progress Notes (Addendum)
Recheck CBG 11---- 1 amp d50 given. Recheck at 1401 reads 126. 1720-awaiting B met results. Pt is asymptomatic. Dr Irish Lack has been kept informed And has been in to see the patient.

## 2017-04-28 NOTE — Progress Notes (Addendum)
Site area: RFA Site Prior to Removal:  Level 0 Pressure Applied For:25 min Manual:  yes  Patient Status During Pull:  stable Post Pull Site:  Level0 Post Pull Instructions Given:  yes Post Pull Pulses Present:  Dressing Applied:  tegaderm Bedrest begins @ 1420 till 1820 Comments:

## 2017-04-28 NOTE — Progress Notes (Signed)
On arrival to holding area, Jennifer Ayers was diaphoretic and hypotensive and stated that she was nauseous. She was given 4mg  zofran IV push, given a 250 bolus of normal saline, and was placed in [place]. Pressure up from 92/47 to 102/59.

## 2017-04-29 ENCOUNTER — Encounter (HOSPITAL_COMMUNITY): Payer: Self-pay | Admitting: Interventional Cardiology

## 2017-04-29 MED FILL — Verapamil HCl IV Soln 2.5 MG/ML: INTRAVENOUS | Qty: 2 | Status: AC

## 2017-04-29 MED FILL — Heparin Sodium (Porcine) 2 Unit/ML in Sodium Chloride 0.9%: INTRAMUSCULAR | Qty: 1000 | Status: AC

## 2017-05-02 ENCOUNTER — Telehealth: Payer: Self-pay | Admitting: Cardiology

## 2017-05-02 NOTE — Telephone Encounter (Signed)
Pt is supposed to go back to work tomorrow after having cath Friday but she is still bruised and sore

## 2017-05-02 NOTE — Telephone Encounter (Signed)
Per Dr. Tomie Chinaevankar patient may have a couple more days home to promote healing and recovery. Patient will come to the office tomorrow to do a site check to ensure patient safety.

## 2017-05-03 ENCOUNTER — Ambulatory Visit (INDEPENDENT_AMBULATORY_CARE_PROVIDER_SITE_OTHER): Payer: Self-pay | Admitting: Cardiology

## 2017-05-03 DIAGNOSIS — Z9889 Other specified postprocedural states: Secondary | ICD-10-CM

## 2017-05-03 MED ORDER — AMOXICILLIN-POT CLAVULANATE 875-125 MG PO TABS: 1.0000 | ORAL_TABLET | Freq: Two times a day (BID) | ORAL | 0 refills | Status: DC

## 2017-05-03 MED FILL — AMOX-CLAV 875-125 MG TABLET: 875-125 | 7 days supply | Qty: 14 | Fill #0

## 2017-05-03 NOTE — Progress Notes (Signed)
Patient's cath access site's were accessed with both palpation and doppler; no abnormalities noted. Patient complained to have swelling the right upper forearm from a previously inserted IV; appeared to be phlebitis. Right radial site appeared to have slight bruising and tenderness. Dr. Tomie Chinaevankar explained to the patient that we would initiate a seven day course of antibiotics for her safety post heart cath; patient was agreeable. 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

## 2017-05-03 NOTE — Patient Instructions (Signed)
Medication Instructions:  Your physician has recommended you make the following change in your medication:  START augmentin 875 mg twice daily for 7 days  Labwork: None  Testing/Procedures: None  Follow-Up: Your physician recommends that you schedule a follow-up appointment in: as directed previously  Any Other Special Instructions Will Be Listed Below (If Applicable).     If you need a refill on your cardiac medications before your next appointment, please call your pharmacy.   [facility]  Garey HamAshley A, RN, BSN   Phlebitis Phlebitis is soreness and puffiness (swelling) in a vein. Follow these instructions at home:  Only take medicine as told by your doctor.  Raise (elevate) the affected limb on a pillow as told by your doctor.  Keep a warm pack on the affected vein as told by your doctor. Do not sleep with a heating pad.  Use special stockings or bandages around the area of the affected vein as told by your doctor. These will speed healing and keep the condition from coming back.  Talk to your doctor about all the medicines you take.  Get follow-up blood tests as told by your doctor.  If the phlebitis is in your legs: ? Avoid standing or resting for long periods. ? Keep your legs moving. Raise your legs when you sit or lie.  Do not smoke.  Follow-up with your doctor as told. Contact a doctor if:  You have strange bruises or bleeding.  Your puffiness or pain in the affected area is not getting better.  You are taking medicine to lessen puffiness (anti-inflammatory medicine), and you get belly pain.  You have a fever. Get help right away if:  The phlebitis gets worse and you have more pain, puffiness (swelling), or redness.  You have trouble breathing or have chest pain. This information is not intended to replace advice given to you by your health care provider. Make sure you discuss any questions you have with your health care provider. Document  Released: 02/10/2009 Document Revised: 07/31/2015 Document Reviewed: 10/30/2012 Elsevier Interactive Patient Education  2017 [employer]

## 2017-05-09 MED FILL — NITROSTAT 0.4 MG TABLET SL: 0.4 | 30 days supply | Qty: 25 | Fill #0

## 2017-05-09 MED FILL — HYDROCHLOROTHIAZIDE 25 MG T: 25 | 30 days supply | Qty: 30 | Fill #1

## 2017-05-10 ENCOUNTER — Ambulatory Visit (HOSPITAL_BASED_OUTPATIENT_CLINIC_OR_DEPARTMENT_OTHER): Payer: No Typology Code available for payment source

## 2017-05-13 ENCOUNTER — Ambulatory Visit: Payer: Self-pay | Admitting: Family Medicine

## 2017-05-16 ENCOUNTER — Ambulatory Visit: Payer: No Typology Code available for payment source | Admitting: Cardiology

## 2017-05-17 ENCOUNTER — Encounter: Payer: Self-pay | Admitting: Family Medicine

## 2017-05-17 ENCOUNTER — Ambulatory Visit (INDEPENDENT_AMBULATORY_CARE_PROVIDER_SITE_OTHER): Payer: Self-pay | Admitting: Family Medicine

## 2017-05-17 VITALS — BP 146/88 | HR 78 | Temp 98.4°F | Resp 16 | Ht 61.0 in | Wt 187.0 lb

## 2017-05-17 DIAGNOSIS — I10 Essential (primary) hypertension: Secondary | ICD-10-CM

## 2017-05-17 DIAGNOSIS — F17209 Nicotine dependence, unspecified, with unspecified nicotine-induced disorders: Secondary | ICD-10-CM

## 2017-05-17 LAB — POCT URINALYSIS DIP (DEVICE)
Bilirubin Urine: NEGATIVE
Glucose, UA: NEGATIVE mg/dL
Ketones, ur: NEGATIVE mg/dL
Leukocytes, UA: NEGATIVE
NITRITE: NEGATIVE
PH: 6.5 (ref 5.0–8.0)
PROTEIN: NEGATIVE mg/dL
Specific Gravity, Urine: 1.025 (ref 1.005–1.030)
UROBILINOGEN UA: 1 mg/dL (ref 0.0–1.0)

## 2017-05-17 MED ORDER — BUPROPION HCL ER (SR) 150 MG PO TB12: 150.0000 mg | ORAL_TABLET | Freq: Two times a day (BID) | ORAL | 1 refills | Status: DC

## 2017-05-17 MED ORDER — LACTULOSE 10 GM/15ML PO SOLN: 10.0000 g | Freq: Three times a day (TID) | ORAL | 1 refills | Status: DC

## 2017-05-17 MED ORDER — CLONIDINE HCL 0.1 MG PO TABS: 0.1000 mg | ORAL_TABLET | Freq: Three times a day (TID) | ORAL | 1 refills | Status: DC | PRN

## 2017-05-17 MED FILL — ?CLONIDINE HCL 0.1 MG TABL: 0.1 | 30 days supply | Qty: 90 | Fill #0

## 2017-05-17 MED FILL — LACTULOSE Solution 10g/15ml: 10 | 10 days supply | Qty: 473 | Fill #0

## 2017-05-17 MED FILL — BUPROPION SR 150 MG TABLET: 150 | 30 days supply | Qty: 60 | Fill #0

## 2017-05-17 NOTE — Progress Notes (Signed)
Patient ID: Doneta Bayman, female    DOB: 16-Mar-1975, 42 y.o.   MRN: [id number]  PCP: Bing Neighbors, FNP  Chief Complaint  Patient presents with  . Follow-up    2 month on routine care    Subjective:  HPI Belanna Kirker is a 42 y.o. female with hypertension, non-obstructive CAD, presents for hypertension follow-up. Pennie recently underwent a left heart cath and was found to have non-obstructive CAD which lifestyle and medication management of blood pressure was recommended. Eladia continues to smoke although she is very interested in smoking cessation. She reports consistently taking prescribed medication. She has remained free of chest pain, although has nitroglycerin on hand if symptoms occur. Denies chest pain, edema, headaches, or palpitations. She continues to remain sedentary, although is making efforts to improve dietary choices. Current Body mass index is 35.33 kg/m.  Social History   Socioeconomic History  . Marital status: Single    Spouse name: Not on file  . Number of children: Not on file  . Years of education: Not on file  . Highest education level: Not on file  Social Needs  . Financial resource strain: Not on file  . Food insecurity - worry: Not on file  . Food insecurity - inability: Not on file  . Transportation needs - medical: Not on file  . Transportation needs - non-medical: Not on file  Occupational History  . Not on file  Tobacco Use  . Smoking status: Current Every Day Smoker    Years: 5.00    Types: Cigarettes  . Smokeless tobacco: Never Used  Substance and Sexual Activity  . Alcohol use: No  . Drug use: No  . Sexual activity: Yes    Birth control/protection: None  Other Topics Concern  . Not on file  Social History Narrative  . Not on file    Family History  Problem Relation Age of Onset  . Diabetes Mother   . Hypertension Mother   . Diabetes Father   . Hypertension Father    Review of Systems Pertinent negatives listed  in HPI  Patient Active Problem List   Diagnosis Date Noted  . Precordial pain   . Cigar smoker 04/26/2017  . Cigarette smoker 04/26/2017  . Dyspnea on exertion 04/18/2017  . Accelerated hypertension 11/19/2016  . HTN (hypertension) 07/11/2016  . Right carotid bruit 07/11/2016    Allergies  Allergen Reactions  . Amlodipine Other (See Comments)    Really severe headaches     Prior to Admission medications   Medication Sig Start Date End Date Taking? Authorizing Provider  acetaminophen (TYLENOL) 500 MG tablet Take 1 tablet (500 mg total) by mouth every 6 (six) hours as needed. Patient taking differently: Take 500 mg by mouth every 6 (six) hours as needed for moderate pain or headache.  11/18/16  Yes Massie Maroon, FNP  amoxicillin-clavulanate (AUGMENTIN) 875-125 MG tablet Take 1 tablet by mouth 2 (two) times daily. 05/03/17  Yes Revankar, Aundra Dubin, MD  aspirin EC 81 MG tablet Take 1 tablet (81 mg total) by mouth daily. 07/06/16  Yes Bing Neighbors, FNP  atenolol (TENORMIN) 25 MG tablet Take 25 mg by mouth 2 (two) times daily.   Yes [provider]  atorvastatin (LIPITOR) 20 MG tablet Take 1 tablet (20 mg total) by mouth daily. 07/19/16  Yes Bing Neighbors, FNP  cetirizine (ZYRTEC) 10 MG tablet Take 1 tablet (10 mg total) by mouth daily. 07/06/16  Yes Bing Neighbors, FNP  etonogestrel (NEXPLANON) 68 MG IMPL implant 1 each by Subdermal route once.   Yes [provider]  hydrochlorothiazide (HYDRODIURIL) 25 MG tablet Take 1 tablet (25 mg total) by mouth daily. 11/29/16  Yes Bing NeighborsHarris, Jeannemarie Sawaya S, FNP  lisinopril (PRINIVIL,ZESTRIL) 40 MG tablet Take 1 tablet (40 mg total) by mouth daily. 02/25/17  Yes Bing NeighborsHarris, Reubin Bushnell S, FNP  Blood Pressure Monitoring (BLOOD PRESSURE CUFF) MISC Take blood pressure once daily and notify your provider if blood pressure is greater than 160/90 Patient not taking: Reported on 04/27/2017 07/06/16   Bing NeighborsHarris, Jaideep Pollack S, FNP  cloNIDine (CATAPRES -  DOSED IN MG/24 HR) 0.1 mg/24hr patch Place 1 patch (0.1 mg total) onto the skin once a week. Patient not taking: Reported on 05/17/2017 11/29/16   Bing NeighborsHarris, Aryaman Haliburton S, FNP  nitroGLYCERIN (NITROSTAT) 0.4 MG SL tablet Place 1 tablet (0.4 mg total) under the tongue every 5 (five) minutes as needed for chest pain. Patient not taking: Reported on 05/17/2017 04/26/17 07/25/17  Revankar, Aundra Dubinajan R, MD    Past Medical, Surgical Family and Social History reviewed and updated.    Objective:   Today's Vitals   05/17/17 1430  BP: (!) 146/88  Pulse: 78  Resp: 16  Temp: 98.4 F (36.9 C)  TempSrc: Oral  SpO2: 100%  Weight: 187 lb (84.8 kg)  Height: 5\' 1"  (1.549 m)    Wt Readings from Last 3 Encounters:  05/17/17 187 lb (84.8 kg)  04/28/17 182 lb (82.6 kg)  04/26/17 182 lb (82.6 kg)   Physical Exam Constitutional: Patient appears well-developed and well-nourished. No distress. HENT: Normocephalic, atraumatic. Eyes: Conjunctivae and EOM are normal. PERRLA, no scleral icterus. Neck: Normal ROM. Neck supple. No JVD. No tracheal deviation. No thyromegaly. CVS: RRR, S1/S2 +, no murmurs, no gallops, no carotid bruit.  Pulmonary: Effort and breath sounds normal, no stridor, rhonchi, wheezes, rales.  Musculoskeletal: Normal range of motion. No edema and no tenderness.  Neuro: Alert. Normal reflexes, muscle tone coordination. No cranial nerve deficit. Skin: Skin is warm and dry. No rash noted. Not diaphoretic. No erythema. No pallor. Psychiatric: Normal mood and affect. Behavior, judgment, thought content normal. Assessment & Plan:  1. Essential hypertension, stable. Discontinue clonidine patch due to skin irritation-start clonidine 0.1 mg up to 3 times daily as needed. We have discussed target BP range and blood pressure goal. I have advised patient to check BP regularly and to call us back or report to clinic if the numbers are consistently higher than 140/90. We discussed the importance of compliance with  medical therapy and DASH diet recommended, consequences of uncontrolled hypertension discussed.  Continue all other current BP medications   2. Tobacco use disorder, continuous, will trial Wellbutrin 150 SR every 12 hours daily. Educational literature provider to assist with cessation.   RTC: 6 months for chronic condition management.    Godfrey PickKimberly S. Tiburcio PeaHarris, MSN, FNP-C [facility]  [address] [place], [place] [address] [phone]

## 2017-05-17 NOTE — Patient Instructions (Signed)
Steps to Quit Smoking Smoking tobacco can be bad for your health. It can also affect almost every organ in your body. Smoking puts you and people around you at risk for many serious long-lasting (chronic) diseases. Quitting smoking is hard, but it is one of the best things that you can do for your health. It is never too late to quit. What are the benefits of quitting smoking? When you quit smoking, you lower your risk for getting serious diseases and conditions. They can include:  Lung cancer or lung disease.  Heart disease.  Stroke.  Heart attack.  Not being able to have children (infertility).  Weak bones (osteoporosis) and broken bones (fractures).  If you have coughing, wheezing, and shortness of breath, those symptoms may get better when you quit. You may also get sick less often. If you are pregnant, quitting smoking can help to lower your chances of having a baby of low birth weight. What can I do to help me quit smoking? Talk with your doctor about what can help you quit smoking. Some things you can do (strategies) include:  Quitting smoking totally, instead of slowly cutting back how much you smoke over a period of time.  Going to in-person counseling. You are more likely to quit if you go to many counseling sessions.  Using resources and support systems, such as: ? Online chats with a counselor. ? Phone quitlines. ? Printed self-help materials. ? Support groups or group counseling. ? Text messaging programs. ? Mobile phone apps or applications.  Taking medicines. Some of these medicines may have nicotine in them. If you are pregnant or breastfeeding, do not take any medicines to quit smoking unless your doctor says it is okay. Talk with your doctor about counseling or other things that can help you.  Talk with your doctor about using more than one strategy at the same time, such as taking medicines while you are also going to in-person counseling. This can help make  quitting easier. What things can I do to make it easier to quit? Quitting smoking might feel very hard at first, but there is a lot that you can do to make it easier. Take these steps:  Talk to your family and friends. Ask them to support and encourage you.  Call phone quitlines, reach out to support groups, or work with a counselor.  Ask people who smoke to not smoke around you.  Avoid places that make you want (trigger) to smoke, such as: ? Bars. ? Parties. ? Smoke-break areas at work.  Spend time with people who do not smoke.  Lower the stress in your life. Stress can make you want to smoke. Try these things to help your stress: ? Getting regular exercise. ? Deep-breathing exercises. ? Yoga. ? Meditating. ? Doing a body scan. To do this, close your eyes, focus on one area of your body at a time from head to toe, and notice which parts of your body are tense. Try to relax the muscles in those areas.  Download or buy apps on your mobile phone or tablet that can help you stick to your quit plan. There are many free apps, such as QuitGuide from the CDC (Centers for Disease Control and Prevention). You can find more support from smokefree.gov and other websites.  This information is not intended to replace advice given to you by your health care provider. Make sure you discuss any questions you have with your health care provider. Document Released: 12/19/2008 Document   Revised: 10/21/2015 Document Reviewed: 07/09/2014 Elsevier Interactive Patient Education  2018 Elsevier Inc.  

## 2017-05-24 ENCOUNTER — Ambulatory Visit: Payer: No Typology Code available for payment source | Admitting: Cardiology

## 2017-05-25 MED ORDER — BUPROPION HCL ER (SR) 150 MG PO TB12: 150.0000 mg | ORAL_TABLET | Freq: Two times a day (BID) | ORAL | 1 refills | Status: DC

## 2017-05-25 MED ORDER — NITROGLYCERIN 0.4 MG SL SUBL: 0.4000 mg | SUBLINGUAL_TABLET | SUBLINGUAL | 11 refills | Status: DC | PRN

## 2017-06-03 ENCOUNTER — Telehealth: Payer: Self-pay

## 2017-06-03 NOTE — Telephone Encounter (Signed)
Left voicemail informing the patient that her labwork is overdue for liver and lipid panel. Informed patient that she may have this done in our office or any surrounding labcorps.

## 2017-06-07 ENCOUNTER — Ambulatory Visit (HOSPITAL_BASED_OUTPATIENT_CLINIC_OR_DEPARTMENT_OTHER): Payer: Medicaid Other | Attending: Cardiology

## 2017-06-09 MED FILL — LISINOPRIL 40 MG TAB: 40 | 30 days supply | Qty: 30 | Fill #2

## 2017-06-09 MED FILL — ?ATENOLOL 25MG TABLET: 25 | 15 days supply | Qty: 30 | Fill #1

## 2017-06-09 MED FILL — ?CETIRIZINE HCL 10 MG TABLE: 10 | 30 days supply | Qty: 30 | Fill #5

## 2017-07-19 ENCOUNTER — Other Ambulatory Visit: Payer: Self-pay | Admitting: Family Medicine

## 2017-07-25 ENCOUNTER — Telehealth: Payer: Self-pay

## 2017-07-25 NOTE — Telephone Encounter (Signed)
Called patient regarding overdue labs and patient states due to financial concerns she will call back and reschedule labs being drawn

## 2017-08-02 MED FILL — ?CETIRIZINE HCL 10 MG TABLE: 10 | 30 days supply | Qty: 30 | Fill #0

## 2017-08-02 MED FILL — LISINOPRIL 40 MG TABLET: 40 | 30 days supply | Qty: 30 | Fill #3

## 2017-08-02 MED FILL — ?ATENOLOL 25MG TABLET: 25 | 15 days supply | Qty: 30 | Fill #0

## 2017-11-11 MED FILL — LISINOPRIL 40 MG TABLET: 40 | 30 days supply | Qty: 30 | Fill #4

## 2017-11-11 MED FILL — ATENOLOL 25 MG TABLET: 25 | 15 days supply | Qty: 30 | Fill #1

## 2017-11-17 ENCOUNTER — Ambulatory Visit: Payer: Medicaid Other | Admitting: Family Medicine

## 2017-11-18 ENCOUNTER — Ambulatory Visit: Payer: Self-pay | Attending: Family Medicine

## 2017-12-02 ENCOUNTER — Ambulatory Visit: Payer: Medicaid Other | Admitting: Family Medicine

## 2017-12-06 ENCOUNTER — Ambulatory Visit (INDEPENDENT_AMBULATORY_CARE_PROVIDER_SITE_OTHER): Payer: Self-pay | Admitting: Family Medicine

## 2017-12-06 ENCOUNTER — Encounter: Payer: Self-pay | Admitting: Family Medicine

## 2017-12-06 VITALS — BP 182/90 | HR 86 | Temp 98.4°F | Ht 61.0 in | Wt 189.0 lb

## 2017-12-06 DIAGNOSIS — I10 Essential (primary) hypertension: Secondary | ICD-10-CM

## 2017-12-06 DIAGNOSIS — F419 Anxiety disorder, unspecified: Secondary | ICD-10-CM

## 2017-12-06 DIAGNOSIS — J302 Other seasonal allergic rhinitis: Secondary | ICD-10-CM

## 2017-12-06 DIAGNOSIS — Z09 Encounter for follow-up examination after completed treatment for conditions other than malignant neoplasm: Secondary | ICD-10-CM

## 2017-12-06 DIAGNOSIS — Z131 Encounter for screening for diabetes mellitus: Secondary | ICD-10-CM

## 2017-12-06 LAB — POCT GLYCOSYLATED HEMOGLOBIN (HGB A1C): Hemoglobin A1C: 5.2 % (ref 4.0–5.6)

## 2017-12-06 LAB — POCT URINALYSIS DIP (MANUAL ENTRY)
Bilirubin, UA: NEGATIVE
Glucose, UA: NEGATIVE mg/dL
Ketones, POC UA: NEGATIVE mg/dL
Leukocytes, UA: NEGATIVE
Nitrite, UA: NEGATIVE
Protein Ur, POC: NEGATIVE mg/dL
Spec Grav, UA: 1.025 (ref 1.010–1.025)
Urobilinogen, UA: 0.2 E.U./dL
pH, UA: 5.5 (ref 5.0–8.0)

## 2017-12-06 MED ORDER — CLONIDINE HCL 0.1 MG PO TABS
0.2000 mg | ORAL_TABLET | Freq: Once | ORAL | Status: AC
  Administered 2017-12-06: 0.2 mg via ORAL

## 2017-12-06 MED ORDER — BUPROPION HCL ER (SR) 150 MG PO TB12: 150.0000 mg | ORAL_TABLET | Freq: Two times a day (BID) | ORAL | 1 refills | Status: DC

## 2017-12-06 MED ORDER — CLONIDINE HCL 0.1 MG PO TABS
0.1000 mg | ORAL_TABLET | Freq: Once | ORAL | Status: AC
  Administered 2017-12-06: 0.1 mg via ORAL

## 2017-12-06 NOTE — Progress Notes (Signed)
Follow Up  Subjective:    Patient ID: Jennifer Ayers, female    DOB: 1976-02-07, 42 y.o.   MRN: [id number]   Chief Complaint  Patient presents with  . Follow-up    chronic condition   HPI  Jennifer Ayers is a 42 year old female with a past medical history of Hypertension. She is here today for follow up.   Current Status: Since her last office visit, she is doing well with no complaints. Blood pressure is elevated today. She admits that she has not been taking blood pressure medications as prescribed. She has a strong history of Stroke and Heart Disease in their 40's. She denies visual changes, chest pain, cough, shortness of breath, heart palpitations, and falls. She has occasionally headaches and dizziness with position changes. Denies severe headaches, confusion, seizures, double vision, and blurred vision, nausea and vomiting. She is currently taking care of several family members. Her anxiety level is moderated today.   She denies fevers, chills, fatigue, recent infections, weight loss, and night sweats. No reports of GI problems such as diarrhea, and constipation. She has no reports of blood in stools, dysuria and hematuria. She denies pain today.   Past Medical History:  Diagnosis Date  . Hypertension     Family History  Problem Relation Age of Onset  . Diabetes Mother   . Hypertension Mother   . Diabetes Father   . Hypertension Father     Social History   Socioeconomic History  . Marital status: Single    Spouse name: Not on file  . Number of children: Not on file  . Years of education: Not on file  . Highest education level: Not on file  Occupational History  . Not on file  Social Needs  . Financial resource strain: Not on file  . Food insecurity:    Worry: Not on file    Inability: Not on file  . Transportation needs:    Medical: Not on file    Non-medical: Not on file  Tobacco Use  . Smoking status: Current Every Day Smoker    Years: 5.00    Types: Cigarettes   . Smokeless tobacco: Never Used  Substance and Sexual Activity  . Alcohol use: No  . Drug use: No  . Sexual activity: Yes    Birth control/protection: None  Lifestyle  . Physical activity:    Days per week: Not on file    Minutes per session: Not on file  . Stress: Not on file  Relationships  . Social connections:    Talks on phone: Not on file    Gets together: Not on file    Attends religious service: Not on file    Active member of club or organization: Not on file    Attends meetings of clubs or organizations: Not on file    Relationship status: Not on file  . Intimate partner violence:    Fear of current or ex partner: Not on file    Emotionally abused: Not on file    Physically abused: Not on file    Forced sexual activity: Not on file  Other Topics Concern  . Not on file  Social History Narrative  . Not on file    Past Surgical History:  Procedure Laterality Date  . CESAREAN SECTION    . LEFT HEART CATH AND CORONARY ANGIOGRAPHY N/A 04/28/2017   Procedure: LEFT HEART CATH AND CORONARY ANGIOGRAPHY;  Surgeon: Corky Crafts, MD;  Location: [facility] INVASIVE CV  LAB;  Service: Cardiovascular;  Laterality: N/A;  . TONSILLECTOMY       There is no immunization history on file for this patient.  Current Meds  Medication Sig  . acetaminophen (TYLENOL) 500 MG tablet Take 1 tablet (500 mg total) by mouth every 6 (six) hours as needed. (Patient taking differently: Take 500 mg by mouth every 6 (six) hours as needed for moderate pain or headache. )  . aspirin EC 81 MG tablet Take 1 tablet (81 mg total) by mouth daily.  Marland Kitchen atenolol (TENORMIN) 25 MG tablet TAKE 2 TABLETS BY MOUTH DAILY.  Marland Kitchen atorvastatin (LIPITOR) 20 MG tablet Take 1 tablet (20 mg total) by mouth daily.  . cetirizine (ZYRTEC) 10 MG tablet TAKE 1 TABLET BY MOUTH DAILY.  . cloNIDine (CATAPRES) 0.1 MG tablet Take 1 tablet (0.1 mg total) by mouth 3 (three) times daily as needed. Take only if BP > 140/90  .  etonogestrel (NEXPLANON) 68 MG IMPL implant 1 each by Subdermal route once.  . hydrochlorothiazide (HYDRODIURIL) 25 MG tablet Take 1 tablet (25 mg total) by mouth daily.  Marland Kitchen lactulose (CHRONULAC) 10 GM/15ML solution Take 15 mLs (10 g total) by mouth 3 (three) times daily.  Marland Kitchen lisinopril (PRINIVIL,ZESTRIL) 40 MG tablet Take 1 tablet (40 mg total) by mouth daily.    Allergies  Allergen Reactions  . Amlodipine Other (See Comments)    Really severe headaches     BP (!) 190/110 (BP Location: Left Arm, Patient Position: Sitting, Cuff Size: Large)   Pulse 88   Temp 98.4 F (36.9 C) (Oral)   Ht 5\' 1"  (1.549 m)   Wt 189 lb (85.7 kg)   LMP 11/22/2017   SpO2 100%   BMI 35.71 kg/m    Review of Systems  Constitutional: Negative.   HENT: Positive for congestion.   Respiratory: Positive for cough.   Gastrointestinal: Positive for abdominal distention (Obese).  Genitourinary: Negative.   Musculoskeletal: Positive for arthralgias (generalized).  Skin: Negative.   Allergic/Immunologic: Positive for environmental allergies.  Neurological: Positive for dizziness (Occasional ) and headaches (Occasional).  Psychiatric/Behavioral: Negative.    Objective:   Physical Exam  Constitutional: She is oriented to person, place, and time. She appears well-developed and well-nourished.  Neck: Normal range of motion. Neck supple.  Cardiovascular: Normal rate, regular rhythm, normal heart sounds and intact distal pulses.  Pulmonary/Chest: Effort normal and breath sounds normal.  Abdominal: Soft. Bowel sounds are normal.  Musculoskeletal: Normal range of motion.  Neurological: She is alert and oriented to person, place, and time.  Skin: Skin is warm and dry.  Psychiatric: She has a normal mood and affect. Her behavior is normal. Judgment and thought content normal.  Nursing note and vitals reviewed.  Assessment & Plan:   1. Essential hypertension Blood pressure is elevated today. She will began to take  antihypertensive medications daily on a regular basis, at the same time. Continue meds as prescribed. She will continue to decrease high sodium intake, excessive alcohol intake, increase potassium intake, smoking cessation, and increase physical activity of at least 30 minutes of cardio activity daily. She will continue to follow Heart Healthy or DASH diet. - cloNIDine (CATAPRES) tablet 0.1 mg  2. Screening for diabetes mellitus Hgb A1c is in normal range of 5.2 today. She will continue to decrease foods/beverages high in sugars and carbs and follow Heart Healthy or DASH diet. Increase physical activity to at least 30 minutes cardio exercise daily.  - POCT glycosylated hemoglobin (Hb A1C) -  POCT urinalysis dipstick  3. Seasonal allergies Stable today. She will continue Zyrtec as prescribe.  4. Anxiety She will restart Wellbutrin today.   5. Follow up She will follow up in 1 month.  Meds ordered this encounter  Medications  . buPROPion (WELLBUTRIN SR) 150 MG 12 hr tablet    Sig: Take 1 tablet (150 mg total) by mouth 2 (two) times daily.    Dispense:  180 tablet    Refill:  1  . cloNIDine (CATAPRES) tablet 0.1 mg    Raliegh Ip,  MSN, FNP-C [facility] [address] [place], [place] [address] [phone]

## 2017-12-06 NOTE — Patient Instructions (Signed)
Bupropion extended-release tablets (Depression/Mood Disorders) What is this medicine? BUPROPION (byoo PROE pee on) is used to treat depression. This medicine may be used for other purposes; ask your health care provider or pharmacist if you have questions. COMMON BRAND NAME(S): Aplenzin, Budeprion XL, Forfivo XL, Wellbutrin XL What should I tell my health care provider before I take this medicine? They need to know if you have any of these conditions: -an eating disorder, such as anorexia or bulimia -bipolar disorder or psychosis -diabetes or high blood sugar, treated with medication -glaucoma -head injury or brain tumor -heart disease, previous heart attack, or irregular heart beat -high blood pressure -kidney or liver disease -seizures (convulsions) -suicidal thoughts or a previous suicide attempt -Tourette's syndrome -weight loss -an unusual or allergic reaction to bupropion, other medicines, foods, dyes, or preservatives -breast-feeding -pregnant or trying to become pregnant How should I use this medicine? Take this medicine by mouth with a glass of water. Follow the directions on the prescription label. You can take it with or without food. If it upsets your stomach, take it with food. Do not crush, chew, or cut these tablets. This medicine is taken once daily at the same time each day. Do not take your medicine more often than directed. Do not stop taking this medicine suddenly except upon the advice of your doctor. Stopping this medicine too quickly may cause serious side effects or your condition may worsen. A special MedGuide will be given to you by the pharmacist with each prescription and refill. Be sure to read this information carefully each time. Talk to your pediatrician regarding the use of this medicine in children. Special care may be needed. Overdosage: If you think you have taken too much of this medicine contact a poison control center or emergency room at once. NOTE:  This medicine is only for you. Do not share this medicine with others. What if I miss a dose? If you miss a dose, skip the missed dose and take your next tablet at the regular time. Do not take double or extra doses. What may interact with this medicine? Do not take this medicine with any of the following medications: -linezolid -MAOIs like Azilect, Carbex, Eldepryl, Marplan, Nardil, and Parnate -methylene blue (injected into a vein) -other medicines that contain bupropion like Zyban This medicine may also interact with the following medications: -alcohol -certain medicines for anxiety or sleep -certain medicines for blood pressure like metoprolol, propranolol -certain medicines for depression or psychotic disturbances -certain medicines for HIV or AIDS like efavirenz, lopinavir, nelfinavir, ritonavir -certain medicines for irregular heart beat like propafenone, flecainide -certain medicines for Parkinson's disease like amantadine, levodopa -certain medicines for seizures like carbamazepine, phenytoin, phenobarbital -cimetidine -clopidogrel -cyclophosphamide -digoxin -furazolidone -isoniazid -nicotine -orphenadrine -procarbazine -steroid medicines like prednisone or cortisone -stimulant medicines for attention disorders, weight loss, or to stay awake -tamoxifen -theophylline -thiotepa -ticlopidine -tramadol -warfarin This list may not describe all possible interactions. Give your health care provider a list of all the medicines, herbs, non-prescription drugs, or dietary supplements you use. Also tell them if you smoke, drink alcohol, or use illegal drugs. Some items may interact with your medicine. What should I watch for while using this medicine? Tell your doctor if your symptoms do not get better or if they get worse. Visit your doctor or health care professional for regular checks on your progress. Because it may take several weeks to see the full effects of this medicine, it  is important to continue your treatment as   prescribed by your doctor. Patients and their families should watch out for new or worsening thoughts of suicide or depression. Also watch out for sudden changes in feelings such as feeling anxious, agitated, panicky, irritable, hostile, aggressive, impulsive, severely restless, overly excited and hyperactive, or not being able to sleep. If this happens, especially at the beginning of treatment or after a change in dose, call your health care professional. Avoid alcoholic drinks while taking this medicine. Drinking large amounts of alcoholic beverages, using sleeping or anxiety medicines, or quickly stopping the use of these agents while taking this medicine may increase your risk for a seizure. Do not drive or use heavy machinery until you know how this medicine affects you. This medicine can impair your ability to perform these tasks. Do not take this medicine close to bedtime. It may prevent you from sleeping. Your mouth may get dry. Chewing sugarless gum or sucking hard candy, and drinking plenty of water may help. Contact your doctor if the problem does not go away or is severe. The tablet shell for some brands of this medicine does not dissolve. This is normal. The tablet shell may appear whole in the stool. This is not a cause for concern. What side effects may I notice from receiving this medicine? Side effects that you should report to your doctor or health care professional as soon as possible: -allergic reactions like skin rash, itching or hives, swelling of the face, lips, or tongue -breathing problems -changes in vision -confusion -elevated mood, decreased need for sleep, racing thoughts, impulsive behavior -fast or irregular heartbeat -hallucinations, loss of contact with reality -increased blood pressure -redness, blistering, peeling or loosening of the skin, including inside the mouth -seizures -suicidal thoughts or other mood  changes -unusually weak or tired -vomiting Side effects that usually do not require medical attention (report to your doctor or health care professional if they continue or are bothersome): -constipation -headache -loss of appetite -nausea -tremors -weight loss This list may not describe all possible side effects. Call your doctor for medical advice about side effects. You may report side effects to FDA at 1-800-FDA-1088. Where should I keep my medicine? Keep out of the reach of children. Store at room temperature between 15 and 30 degrees C (59 and 86 degrees F). Throw away any unused medicine after the expiration date. NOTE: This sheet is a summary. It may not cover all possible information. If you have questions about this medicine, talk to your doctor, pharmacist, or health care provider.  2018 Elsevier/Gold Standard (2015-08-15 13:55:13)  

## 2017-12-26 ENCOUNTER — Other Ambulatory Visit: Payer: Self-pay | Admitting: Internal Medicine

## 2017-12-26 ENCOUNTER — Other Ambulatory Visit: Payer: Self-pay | Admitting: Family Medicine

## 2017-12-26 MED FILL — LISINOPRIL 40 MG TABLET: 40 | 30 days supply | Qty: 30 | Fill #5

## 2017-12-28 MED FILL — HYDROCHLOROTHIAZIDE 25 MG T: 25 | 30 days supply | Qty: 30 | Fill #0

## 2017-12-29 ENCOUNTER — Other Ambulatory Visit: Payer: Self-pay | Admitting: Internal Medicine

## 2017-12-30 ENCOUNTER — Other Ambulatory Visit: Payer: Self-pay

## 2017-12-30 MED ORDER — ATENOLOL 25 MG PO TABS: 50.0000 mg | ORAL_TABLET | Freq: Every day | ORAL | 1 refills | Status: DC

## 2017-12-30 NOTE — Telephone Encounter (Signed)
Medication refill

## 2018-01-10 ENCOUNTER — Telehealth: Payer: Self-pay

## 2018-01-10 MED FILL — BUPROPION SR 150 MG TABLET: 150 | 30 days supply | Qty: 60 | Fill #0

## 2018-01-10 MED FILL — ATENOLOL 25 MG TABLET: 25 | 15 days supply | Qty: 30 | Fill #0

## 2018-01-10 NOTE — Telephone Encounter (Signed)
Called and spoke with patient. She will keep appointment for 01/11/2018. Thanks!  

## 2018-01-11 ENCOUNTER — Ambulatory Visit: Payer: Medicaid Other | Admitting: Family Medicine

## 2018-01-25 ENCOUNTER — Ambulatory Visit: Payer: Medicaid Other | Admitting: Family Medicine

## 2018-02-12 ENCOUNTER — Emergency Department (HOSPITAL_COMMUNITY)
Admission: EM | Admit: 2018-02-12 | Discharge: 2018-02-12 | Disposition: A | Discharge status: Home / Self Care | Payer: Medicaid Other | Attending: Emergency Medicine | Admitting: Emergency Medicine

## 2018-02-12 ENCOUNTER — Emergency Department (HOSPITAL_COMMUNITY): Payer: Medicaid Other

## 2018-02-12 ENCOUNTER — Encounter (HOSPITAL_COMMUNITY): Payer: Self-pay | Admitting: Emergency Medicine

## 2018-02-12 DIAGNOSIS — R1013 Epigastric pain: Secondary | ICD-10-CM | POA: Insufficient documentation

## 2018-02-12 DIAGNOSIS — Z79899 Other long term (current) drug therapy: Secondary | ICD-10-CM | POA: Insufficient documentation

## 2018-02-12 DIAGNOSIS — I10 Essential (primary) hypertension: Secondary | ICD-10-CM | POA: Insufficient documentation

## 2018-02-12 DIAGNOSIS — K2970 Gastritis, unspecified, without bleeding: Secondary | ICD-10-CM | POA: Insufficient documentation

## 2018-02-12 DIAGNOSIS — R110 Nausea: Secondary | ICD-10-CM

## 2018-02-12 DIAGNOSIS — F17210 Nicotine dependence, cigarettes, uncomplicated: Secondary | ICD-10-CM | POA: Insufficient documentation

## 2018-02-12 LAB — CBC
HCT: 50.4 % — ABNORMAL HIGH (ref 36.0–46.0)
Hemoglobin: 16.4 g/dL — ABNORMAL HIGH (ref 12.0–15.0)
MCH: 30.5 pg (ref 26.0–34.0)
MCHC: 32.5 g/dL (ref 30.0–36.0)
MCV: 93.7 fL (ref 80.0–100.0)
Platelets: 322 10*3/uL (ref 150–400)
RBC: 5.38 MIL/uL — ABNORMAL HIGH (ref 3.87–5.11)
RDW: 13.8 % (ref 11.5–15.5)
WBC: 7.9 10*3/uL (ref 4.0–10.5)
nRBC: 0 % (ref 0.0–0.2)

## 2018-02-12 LAB — COMPREHENSIVE METABOLIC PANEL
ALBUMIN: 4 g/dL (ref 3.5–5.0)
ALK PHOS: 75 U/L (ref 38–126)
ALT: 15 U/L (ref 0–44)
AST: 15 U/L (ref 15–41)
Anion gap: 7 (ref 5–15)
BUN: 14 mg/dL (ref 6–20)
CO2: 25 mmol/L (ref 22–32)
CREATININE: 1.33 mg/dL — AB (ref 0.44–1.00)
Calcium: 9.2 mg/dL (ref 8.9–10.3)
Chloride: 107 mmol/L (ref 98–111)
GFR calc Af Amer: 57 mL/min — ABNORMAL LOW (ref >60)
GFR calc non Af Amer: 50 mL/min — ABNORMAL LOW (ref >60)
GLUCOSE: 89 mg/dL (ref 70–99)
Potassium: 3.8 mmol/L (ref 3.5–5.1)
SODIUM: 139 mmol/L (ref 135–145)
Total Bilirubin: 0.7 mg/dL (ref 0.3–1.2)
Total Protein: 7.7 g/dL (ref 6.5–8.1)

## 2018-02-12 LAB — URINALYSIS, ROUTINE W REFLEX MICROSCOPIC
Bilirubin Urine: NEGATIVE
Glucose, UA: NEGATIVE mg/dL
KETONES UR: NEGATIVE mg/dL
Leukocytes, UA: NEGATIVE
Nitrite: NEGATIVE
PH: 7 (ref 5.0–8.0)
Protein, ur: 30 mg/dL — AB
SPECIFIC GRAVITY, URINE: 1.026 (ref 1.005–1.030)

## 2018-02-12 LAB — I-STAT TROPONIN, ED: TROPONIN I, POC: 0.01 ng/mL (ref 0.00–0.08)

## 2018-02-12 LAB — I-STAT BETA HCG BLOOD, ED (MC, WL, AP ONLY): I-stat hCG, quantitative: <5 m[IU]/mL (ref <5)

## 2018-02-12 LAB — LIPASE, BLOOD: Lipase: 34 U/L (ref 11–51)

## 2018-02-12 MED ORDER — FAMOTIDINE IN NACL 20-0.9 MG/50ML-% IV SOLN
20.0000 mg | Freq: Once | INTRAVENOUS | Status: AC
  Administered 2018-02-12: 20 mg via INTRAVENOUS
  Filled 2018-02-12: qty 50

## 2018-02-12 MED ORDER — SODIUM CHLORIDE (PF) 0.9 % IJ SOLN
INTRAMUSCULAR | Status: AC
  Filled 2018-02-12: qty 50

## 2018-02-12 MED ORDER — IOPAMIDOL (ISOVUE-370) INJECTION 76%
100.0000 mL | Freq: Once | INTRAVENOUS | Status: AC | PRN
  Administered 2018-02-12: 100 mL via INTRAVENOUS

## 2018-02-12 MED ORDER — ALUM & MAG HYDROXIDE-SIMETH 200-200-20 MG/5ML PO SUSP
30.0000 mL | Freq: Once | ORAL | Status: AC
  Administered 2018-02-12: 30 mL via ORAL
  Filled 2018-02-12: qty 30

## 2018-02-12 MED ORDER — RANITIDINE HCL 150 MG PO TABS: 150.0000 mg | ORAL_TABLET | Freq: Two times a day (BID) | ORAL | 0 refills | Status: DC

## 2018-02-12 MED ORDER — ONDANSETRON HCL 4 MG/2ML IJ SOLN
4.0000 mg | Freq: Once | INTRAMUSCULAR | Status: DC
  Filled 2018-02-12: qty 2

## 2018-02-12 MED ORDER — ONDANSETRON 4 MG PO TBDP: 4.0000 mg | ORAL_TABLET | Freq: Three times a day (TID) | ORAL | 0 refills | Status: DC | PRN

## 2018-02-12 MED ORDER — IOPAMIDOL (ISOVUE-370) INJECTION 76%
INTRAVENOUS | Status: AC
  Filled 2018-02-12: qty 100

## 2018-02-12 MED ORDER — LIDOCAINE VISCOUS HCL 2 % MT SOLN
15.0000 mL | Freq: Once | OROMUCOSAL | Status: AC
  Administered 2018-02-12: 15 mL via ORAL
  Filled 2018-02-12: qty 15

## 2018-02-12 NOTE — ED Notes (Signed)
EKG given to EDP,Allen,MD., for review. 

## 2018-02-12 NOTE — ED Triage Notes (Signed)
Pt reports upper abd pains that will last 2-3 hours then go away. Denies n/v/d. Pt took laxative yesterday and didn't relieve pains. also tried taking heart Prilosec last night without relief.  Pt works at a school and around sick people. Mother has Hx ulcers.

## 2018-02-12 NOTE — Discharge Instructions (Addendum)
Your work up today has been reassuring. Your abdominal pain could be from gastritis or an ulcer. You will need to take zantac as directed, and avoid spicy/fatty/acidic foods, avoid soda/coffee/tea/alcohol. Avoid laying down flat within 30 minutes of eating. Avoid NSAIDs like ibuprofen/aleve/motrin/etc on an empty stomach. May consider using over the counter tums/maalox as needed for additional relief. Use zofran as directed as needed for nausea. Use tylenol as needed for pain.   It's possible that your symptoms could still be from a gallbladder issue however it doesn't look like there is any emergent issue going on with your gallbladder; if symptoms persist, your regular doctor may want to get further studies such as a HIDA scan to fully evaluate the gallbladder.   You also need to control your blood pressure better. Follow the DASH diet to help with this, and take all of your usual home medications.   Follow up with your regular doctor in 5-7 days for recheck of symptoms. Return to the ER for changes or worsening symptoms.  Abdominal (belly) pain can be caused by many things. Your caregiver performed an examination and possibly ordered blood/urine tests and imaging (CT scan, x-rays, ultrasound). Many cases can be observed and treated at home after initial evaluation in the emergency department. Even though you are being discharged home, abdominal pain can be unpredictable. Therefore, you need a repeated exam if your pain does not resolve, returns, or worsens. Most patients with abdominal pain don't have to be admitted to the hospital or have surgery, but serious problems like appendicitis and gallbladder attacks can start out as nonspecific pain. Many abdominal conditions cannot be diagnosed in one visit, so follow-up evaluations are very important. SEEK IMMEDIATE MEDICAL ATTENTION IF YOU DEVELOP ANY OF THE FOLLOWING SYMPTOMS: The pain does not go away or becomes severe.  A temperature above 101  develops.  Repeated vomiting occurs (multiple episodes).  The pain becomes localized to portions of the abdomen. The right side could possibly be appendicitis. In an adult, the left lower portion of the abdomen could be colitis or diverticulitis.  Blood is being passed in stools or vomit (bright red or black tarry stools).  Return also if you develop chest pain, difficulty breathing, dizziness or fainting, or become confused, poorly responsive, or inconsolable (young children). The constipation stays for more than 4 days.  There is belly (abdominal) or rectal pain.  You do not seem to be getting better.

## 2018-02-12 NOTE — ED Provider Notes (Signed)
[facility]-EMERGENCY DEPT Provider Note   CSN: [id number] Arrival date & time: 02/12/18  1319     History   Chief Complaint Chief Complaint  Patient presents with  . Abdominal Pain    HPI Jennifer Ayers is a 42 y.o. female with a PMHx of HTN, who presents to the ED with complaints of upper abdominal pain that began 3 days ago and has been waxing and waning since then.  She describes the pain as 7/10 constant waxing and waning throbbing epigastric pain that radiates to her upper and somewhat lower mid back, worse with standing up, improved with sitting down, and unrelieved with Tylenol and Prilosec.  She reports associated nausea.  Of note, she takes lisinopril, atenolol, and HCTZ for her blood pressure, she took lisinopril last night and atenolol and HCTZ this morning, but she has not taken any clonidine today which she is prescribed as needed for when her blood pressure is up.  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  She has had a C-section but denies any other prior abdominal surgeries.  She denies any recent travel, suspicious food intake, alcohol use, or frequent NSAID use.  She also denies having any headaches, vision changes, leg swelling, fevers, chills, chest pain, shortness of breath, vomiting, diarrhea, constipation, obstipation, melena, hematochezia, dysuria, hematuria, vaginal bleeding or discharge, myalgias, arthralgias, numbness, tingling, focal weakness, or any other complaints at this time.  The history is provided by the patient and medical records. No language interpreter was used.  Abdominal Pain   Associated symptoms include nausea. Pertinent negatives include fever, diarrhea, vomiting, constipation, dysuria, hematuria, headaches, arthralgias and myalgias.    Past Medical History:  Diagnosis Date  . Hypertension     Patient Active Problem List   Diagnosis Date Noted  .  Precordial pain   . Cigar smoker 04/26/2017  . Cigarette smoker 04/26/2017  . Dyspnea on exertion 04/18/2017  . Accelerated hypertension 11/19/2016  . HTN (hypertension) 07/11/2016  . Right carotid bruit 07/11/2016    Past Surgical History:  Procedure Laterality Date  . CESAREAN SECTION    . LEFT HEART CATH AND CORONARY ANGIOGRAPHY N/A 04/28/2017   Procedure: LEFT HEART CATH AND CORONARY ANGIOGRAPHY;  Surgeon: Corky Crafts, MD;  Location: [facility] INVASIVE CV LAB;  Service: Cardiovascular;  Laterality: N/A;  . TONSILLECTOMY       OB History    Gravida  5   Para  1   Term  1   Preterm  0   AB  3   Living  1     SAB  2   TAB  1   Ectopic  0   Multiple  0   Live Births  1            Home Medications    Prior to Admission medications   Medication Sig Start Date End Date Taking? Authorizing Provider  acetaminophen (TYLENOL) 500 MG tablet Take 1 tablet (500 mg total) by mouth every 6 (six) hours as needed. Patient taking differently: Take 500 mg by mouth every 6 (six) hours as needed for moderate pain or headache.  11/18/16  Yes Massie Maroon, FNP  aspirin EC 81 MG tablet Take 1 tablet (81 mg total) by mouth daily. 07/06/16  Yes Bing Neighbors, FNP  atenolol (TENORMIN) 25 MG tablet Take 2 tablets (50 mg total) by mouth daily. Patient taking  differently: Take 25 mg by mouth 2 (two) times daily.  12/30/17  Yes Kallie Locks, FNP  atorvastatin (LIPITOR) 20 MG tablet Take 1 tablet (20 mg total) by mouth daily. 07/19/16  Yes Bing Neighbors, FNP  buPROPion [facility] SR) 150 MG 12 hr tablet Take 1 tablet (150 mg total) by mouth 2 (two) times daily. 12/06/17  Yes Kallie Locks, FNP  cetirizine (ZYRTEC) 10 MG tablet TAKE 1 TABLET BY MOUTH DAILY. 07/19/17  Yes Quentin Angst, MD  Chlorphen-Phenyleph-APAP (CORICIDIN D COLD/FLU/SINUS) 2-5-325 MG TABS Take 1 tablet by mouth daily as needed (cold symptoms).   Yes [provider]  etonogestrel  (NEXPLANON) 68 MG IMPL implant 1 each by Subdermal route once.   Yes [provider]  hydrochlorothiazide (HYDRODIURIL) 25 MG tablet TAKE 1 TABLET BY MOUTH DAILY 12/27/17  Yes Kallie Locks, FNP  lisinopril (PRINIVIL,ZESTRIL) 40 MG tablet Take 1 tablet (40 mg total) by mouth daily. 02/25/17  Yes Bing Neighbors, FNP  nitroGLYCERIN (NITROSTAT) 0.4 MG SL tablet Place 0.4 mg under the tongue every 5 (five) minutes as needed for chest pain.   Yes [provider]  Sennosides (LAXATIVE) 25 MG TABS Take 1 tablet by mouth daily as needed (constipation).   Yes [provider]  cloNIDine (CATAPRES) 0.1 MG tablet Take 1 tablet (0.1 mg total) by mouth 3 (three) times daily as needed. Take only if BP > 140/90 Patient taking differently: Take 0.1 mg by mouth 3 (three) times daily as needed (high blood pressure). Take only if BP > 140/90 05/17/17   Bing Neighbors, FNP  hydrochlorothiazide (HYDRODIURIL) 25 MG tablet Take 1 tablet (25 mg total) by mouth daily. Patient not taking: Reported on 02/12/2018 11/29/16   Bing Neighbors, FNP  lactulose (CHRONULAC) 10 GM/15ML solution Take 15 mLs (10 g total) by mouth 3 (three) times daily. Patient not taking: Reported on 02/12/2018 05/17/17   Bing Neighbors, FNP  nitroGLYCERIN (NITROSTAT) 0.4 MG SL tablet Place 1 tablet (0.4 mg total) under the tongue every 5 (five) minutes as needed for chest pain. 05/25/17 08/23/17  Bing Neighbors, FNP    Family History Family History  Problem Relation Age of Onset  . Diabetes Mother   . Hypertension Mother   . Diabetes Father   . Hypertension Father     Social History Social History   Tobacco Use  . Smoking status: Current Every Day Smoker    Years: 5.00    Types: Cigarettes  . Smokeless tobacco: Never Used  Substance Use Topics  . Alcohol use: No  . Drug use: No     Allergies   Amlodipine   Review of Systems Review of Systems  Constitutional: Negative for chills and  fever.  Eyes: Negative for visual disturbance.  Respiratory: Negative for shortness of breath.   Cardiovascular: Negative for chest pain and leg swelling.  Gastrointestinal: Positive for abdominal pain and nausea. Negative for blood in stool, constipation, diarrhea and vomiting.  Genitourinary: Negative for dysuria, hematuria, vaginal bleeding and vaginal discharge.  Musculoskeletal: Negative for arthralgias and myalgias.  Skin: Negative for color change.  Allergic/Immunologic: Negative for immunocompromised state.  Neurological: Negative for weakness, numbness and headaches.  Psychiatric/Behavioral: Negative for confusion.   All other systems reviewed and are negative for acute change except as noted in the HPI.    Physical Exam Updated Vital Signs BP (!) 166/123 (BP Location: Left Arm)   Pulse 62   Temp 97.9 F (36.6  C) (Oral)   Resp 18   SpO2 100%   Physical Exam  Constitutional: She is oriented to person, place, and time. Vital signs are normal. She appears well-developed and well-nourished.  Non-toxic appearance. No distress.  Afebrile, nontoxic, NAD, BP 190/124 in triage similar to recent prior visit however down to 166/123 in room during evaluation  HENT:  Head: Normocephalic and atraumatic.  Mouth/Throat: Oropharynx is clear and moist and mucous membranes are normal.  Eyes: Conjunctivae and EOM are normal. Right eye exhibits no discharge. Left eye exhibits no discharge.  Neck: Normal range of motion. Neck supple.  Cardiovascular: Normal rate, regular rhythm, normal heart sounds and intact distal pulses. Exam reveals no gallop and no friction rub.  No murmur heard. RRR, nl s1/s2, no m/r/g, distal pulses intact  Pulmonary/Chest: Effort normal and breath sounds normal. No respiratory distress. She has no decreased breath sounds. She has no wheezes. She has no rhonchi. She has no rales.  Abdominal: Soft. Normal appearance and bowel sounds are normal. She exhibits no distension.  There is tenderness in the epigastric area. There is positive Murphy's sign. There is no rigidity, no rebound, no guarding, no CVA tenderness and no tenderness at McBurney's point.  Soft, nondistended, +BS throughout, with very mild epigastric TTP in the subxyphoid region just under the rib margin, slightly positive murphy's sign in this area but not really any tenderness to the RUQ area, no r/g/r, neg mcburney's, no CVA TTP   Musculoskeletal: Normal range of motion.  Neurological: She is alert and oriented to person, place, and time. She has normal strength. No sensory deficit.  Skin: Skin is warm, dry and intact. No rash noted.  Psychiatric: She has a normal mood and affect.  Nursing note and vitals reviewed.    ED Treatments / Results  Labs (all labs ordered are listed, but only abnormal results are displayed) Labs Reviewed  COMPREHENSIVE METABOLIC PANEL - Abnormal; Notable for the following components:      Result Value   Creatinine, Ser 1.33 (*)    GFR calc non Af Amer 50 (*)    GFR calc Af Amer 57 (*)    All other components within normal limits  CBC - Abnormal; Notable for the following components:   RBC 5.38 (*)    Hemoglobin 16.4 (*)    HCT 50.4 (*)    All other components within normal limits  URINALYSIS, ROUTINE W REFLEX MICROSCOPIC - Abnormal; Notable for the following components:   APPearance CLOUDY (*)    Hgb urine dipstick MODERATE (*)    Protein, ur 30 (*)    Bacteria, UA FEW (*)    Squamous Epithelial / LPF >50 (*)    All other components within normal limits  LIPASE, BLOOD  I-STAT BETA HCG BLOOD, ED ([facility], [facility], [facility] ONLY)  I-STAT TROPONIN, ED    EKG EKG Interpretation  Date/Time:  Sunday February 12 2018 15:12:59 EST Ventricular Rate:  57 PR Interval:    QRS Duration: 96 QT Interval:  480 QTC Calculation: 468 R Axis:   -5 Text Interpretation:  Sinus rhythm Left ventricular hypertrophy Abnormal T, consider ischemia, diffuse leads Baseline wander in lead(s)  V6 No significant change since last tracing Confirmed by Doug Sou [phone]) on 02/12/2018 4:24:14 PM   Radiology Ct Angio Chest/abd/pel For Dissection W And/or Wo Contrast  Result Date: 02/12/2018 CLINICAL DATA:  Upper abdominal pain radiates to back. Clinical concern for aortic dissection. EXAM: CT ANGIOGRAPHY CHEST, ABDOMEN AND PELVIS TECHNIQUE: Multidetector  CT imaging through the chest, abdomen and pelvis was performed using the standard protocol during bolus administration of intravenous contrast. Multiplanar reconstructed images and MIPs were obtained and reviewed to evaluate the vascular anatomy. CONTRAST:  100mL ISOVUE-370 IOPAMIDOL (ISOVUE-370) INJECTION 76% COMPARISON:  None. FINDINGS: CTA CHEST FINDINGS Cardiovascular: Precontrast imaging shows no hyperdense crescent in the wall of the thoracic aorta to suggest acute intramural hematoma. Imaging after IV contrast administration shows no dissection of the thoracic aorta. There is no thoracic aortic aneurysm. No large central pulmonary embolus. Mediastinum/Nodes: No mediastinal lymphadenopathy. There is no hilar lymphadenopathy. The esophagus has normal imaging features. There is no axillary lymphadenopathy. Lungs/Pleura: The central tracheobronchial airways are patent. No suspicious pulmonary nodule or mass. No pulmonary edema or pleural effusion. Musculoskeletal: No worrisome lytic or sclerotic osseous abnormality. Review of the MIP images confirms the above findings. CTA ABDOMEN AND PELVIS FINDINGS VASCULAR Aorta: No abdominal aortic aneurysm. No dissection of the abdominal aorta. No appreciable atherosclerosis of the abdominal aorta. Celiac: Widely patent.  Normal branch anatomy. SMA: Widely patent. Renals: Single right renal artery widely patent. Main and accessory left renal arteries are widely patent. IMA: Widely patent. Inflow: Patent bilaterally. Veins: Not well evaluated due to bolus timing. Review of the MIP images confirms the above  findings. NON-VASCULAR Hepatobiliary: No focal abnormality within the liver parenchyma. There is no evidence for gallstones, gallbladder wall thickening, or pericholecystic fluid. No intrahepatic or extrahepatic biliary dilation. Pancreas: No focal mass lesion. No dilatation of the main duct. No intraparenchymal cyst. No peripancreatic edema. Spleen: No splenomegaly. No focal mass lesion. Adrenals/Urinary Tract: No adrenal nodule or mass. Kidneys unremarkable. No evidence for hydroureter. The urinary bladder appears normal for the degree of distention. Stomach/Bowel: Tiny hiatal hernia. Stomach otherwise unremarkable. Duodenum is normally positioned as is the ligament of Treitz. No small bowel wall thickening. No small bowel dilatation. The terminal ileum is normal. The appendix is normal. No gross colonic mass. No colonic wall thickening. Lymphatic: There is no gastrohepatic or hepatoduodenal ligament lymphadenopathy. No intraperitoneal or retroperitoneal lymphadenopathy. No pelvic sidewall lymphadenopathy. Reproductive: Uterus unremarkable.  There is no adnexal mass. Other: No intraperitoneal free fluid. Musculoskeletal: No worrisome lytic or sclerotic osseous abnormality. Review of the MIP images confirms the above findings. IMPRESSION: 1. No evidence for dissection of the thoracoabdominal aorta. 2. No large central pulmonary embolus. 3. No findings to explain the patient's history of abdominal pain radiating to the back. Electronically Signed   By: Kennith CenterEric  Mansell M.D.   On: 02/12/2018 17:04     LHC 04/28/17: Conclusion   Ramus lesion is 25% stenosed.  LV end diastolic pressure is normal.  There is no aortic valve stenosis.  Minimal diffuse disease throughout the coronary arteries.  Unable to obtain radial access due to severe vasospasm.   Nonobstructive CAD.  She needs aggressive risk factor modification including smoking cessation and BP control.      Procedures Procedures (including  critical care time)  Medications Ordered in ED Medications  ondansetron (ZOFRAN) injection 4 mg (4 mg Intravenous Refused 02/12/18 1541)  sodium chloride (PF) 0.9 % injection (has no administration in time range)  iopamidol (ISOVUE-370) 76 % injection (has no administration in time range)  alum & mag hydroxide-simeth (MAALOX/MYLANTA) 200-200-20 MG/5ML suspension 30 mL (30 mLs Oral Given 02/12/18 1541)    And  lidocaine (XYLOCAINE) 2 % viscous mouth solution 15 mL (15 mLs Oral Given 02/12/18 1541)  famotidine (PEPCID) IVPB 20 mg premix (0 mg Intravenous Stopped 02/12/18 1640)  iopamidol (ISOVUE-370) 76 % injection 100 mL (100 mLs Intravenous Contrast Given 02/12/18 1612)     Initial Impression / Assessment and Plan / ED Course  I have reviewed the triage vital signs and the nursing notes.  Pertinent labs & imaging results that were available during my care of the patient were reviewed by me and considered in my medical decision making (see chart for details).     42 y.o. female here with upper abdominal pain for the last 3 days.  On exam, patient with hypertension somewhat similar to recent visits, mild epigastric tenderness with slightly positive Murphy sign although more so in the epigastrium rather than the right upper quadrant.  Non-peritoneal.  Her pain seems more subxiphoid rather than just epigastric, and she describes the pain radiating towards her back, differential includes gallbladder pathology, gastritis, cardiac pathology, dissection, etc.  I think the safest option would be to check a CTA dissection study to ensure this is not the cause of her symptoms, if completely unremarkable will consider right upper quadrant ultrasound to check gallbladder pathology if CT doesn't show what we're looking for.  Of note, she had a cath in 04/2017 which showed nonobstructive CAD. We will get labs, EKG, and give nausea medicine as well as GI cocktail.  Patient declines wanting anything more for pain.   Will reassess shortly.  5:46 PM CBC essentially unremarkable. CMP with mildly elevated Cr 1.33 but similar to prior values. Lipase WNL. Trop neg. BetaHCG neg. EKG with LVH findings and some T wave inversions, similar to prior EKG in 04/2017. U/A grossly contaminated but without findings of UTI. CTA chest/abd/pelv negative for dissection or other findings to explain symptoms, gallbladder without gallstones or pericholcystic fluid or any wall thickening. Doubt need for dedicated RUQ U/S given that the CT showed no findings to suggest cholecystitis. Pt feeling better after GI cocktail and pepcid, declined zofran but is tolerating PO well here. Could be gastritis/GERD/PUD vs bilary dyskinesia.  Discussed diet/lifestyle modifications for symptoms, will start on zantac/zofran, advised tylenol and avoidance/sparing use of NSAIDs only on full stomach, discussed other OTC remedies for symptomatic relief, and f/up with PCP in 5-7 days for recheck of symptoms and ongoing evaluation/management. Discussed possibility of biliary dyskinesia, advised possible further outpatient work up with HIDA scan if symptoms persist; f/up with PCP for this. Also advised improvement of BP control, DASH diet encouraged. I explained the diagnosis and have given explicit precautions to return to the ER including for any other new or worsening symptoms. The patient understands and accepts the medical plan as it's been dictated and I have answered their questions. Discharge instructions concerning home care and prescriptions have been given. The patient is STABLE and is discharged to home in good condition.    Final Clinical Impressions(s) / ED Diagnoses   Final diagnoses:  Epigastric abdominal pain  Nausea  Gastritis, presence of bleeding unspecified, unspecified chronicity, unspecified gastritis type  Essential hypertension    ED Discharge Orders         Ordered    ranitidine (ZANTAC) 150 MG tablet  2 times daily     02/12/18 1746     ondansetron (ZOFRAN ODT) 4 MG disintegrating tablet  Every 8 hours PRN     02/12/18 [address], [place], [place] 02/12/18 1748    Doug Sou, MD 02/17/18 1254

## 2018-02-13 ENCOUNTER — Other Ambulatory Visit: Payer: Self-pay | Admitting: Family Medicine

## 2018-02-13 DIAGNOSIS — I10 Essential (primary) hypertension: Secondary | ICD-10-CM

## 2018-02-13 MED FILL — ?CETIRIZINE HCL 10 MG TABLE: 10 | 30 days supply | Qty: 30 | Fill #1

## 2018-02-13 MED FILL — ?ATENOLOL 25MG TABLET: 25 | 30 days supply | Qty: 30 | Fill #0

## 2018-02-14 MED FILL — ONDANSETRON ODT 4 MG TABLET: 4 | 5 days supply | Qty: 15 | Fill #0

## 2018-02-15 MED FILL — LISINOPRIL 40 MG TABLET: 40 | 30 days supply | Qty: 30 | Fill #0

## 2018-02-20 ENCOUNTER — Ambulatory Visit (INDEPENDENT_AMBULATORY_CARE_PROVIDER_SITE_OTHER): Payer: Self-pay | Admitting: Family Medicine

## 2018-02-20 ENCOUNTER — Encounter: Payer: Self-pay | Admitting: Family Medicine

## 2018-02-20 VITALS — BP 177/106 | HR 74 | Temp 98.0°F | Ht 61.0 in | Wt 191.0 lb

## 2018-02-20 DIAGNOSIS — Z09 Encounter for follow-up examination after completed treatment for conditions other than malignant neoplasm: Secondary | ICD-10-CM

## 2018-02-20 DIAGNOSIS — I10 Essential (primary) hypertension: Secondary | ICD-10-CM

## 2018-02-20 DIAGNOSIS — K219 Gastro-esophageal reflux disease without esophagitis: Secondary | ICD-10-CM

## 2018-02-20 DIAGNOSIS — R8290 Unspecified abnormal findings in urine: Secondary | ICD-10-CM

## 2018-02-20 DIAGNOSIS — K5900 Constipation, unspecified: Secondary | ICD-10-CM

## 2018-02-20 LAB — POCT URINALYSIS DIP (MANUAL ENTRY)
Bilirubin, UA: NEGATIVE
Glucose, UA: NEGATIVE mg/dL
Ketones, POC UA: NEGATIVE mg/dL
Leukocytes, UA: NEGATIVE
Nitrite, UA: NEGATIVE
Spec Grav, UA: 1.02 (ref 1.010–1.025)
Urobilinogen, UA: 0.2 E.U./dL
pH, UA: 5.5 (ref 5.0–8.0)

## 2018-02-20 MED ORDER — ATENOLOL 50 MG PO TABS: 50.0000 mg | ORAL_TABLET | Freq: Every day | ORAL | 3 refills | Status: DC

## 2018-02-20 MED ORDER — LACTULOSE 10 GM/15ML PO SOLN: 10.0000 g | Freq: Three times a day (TID) | ORAL | 11 refills | Status: DC

## 2018-02-20 MED ORDER — FAMOTIDINE 40 MG PO TABS: 40.0000 mg | ORAL_TABLET | Freq: Every day | ORAL | 3 refills | Status: DC

## 2018-02-20 MED ORDER — CLONIDINE HCL 0.1 MG PO TABS
0.2000 mg | ORAL_TABLET | Freq: Once | ORAL | Status: AC
  Administered 2018-02-20: 0.2 mg via ORAL

## 2018-02-20 NOTE — Addendum Note (Signed)
Addended by: Kallie LocksSTROUD, Opal Dinning M on: 02/20/2018 03:24 PM   Modules accepted: Orders

## 2018-02-20 NOTE — Progress Notes (Addendum)
Hospital Follow Up  Subjective:    Patient ID: Jennifer Ayers, female    DOB: 11/03/1975, 42 y.o.   MRN: [id number]  Chief Complaint  Patient presents with  . Hospitalization Follow-up    abdominal pain   . Hypertension   HPI  Jennifer Ayers is a 42 year old female with a past medical history of Hypertension. She is here today for hospital follow up.    Current Status: Since her last office visit, she states that she is currently not taking antihypertensive medications as prescribed. She denies visual changes, chest pain, cough, shortness of breath, heart palpitations, and falls. She states that she has occasionally headaches and dizziness with position changes. Denies severe headaches, confusion, seizures, double vision, and blurred vision, and vomiting. She denies fevers, chills, fatigue, recent infections, weight loss, and night sweats. No reports of GI problems such as nausea, vomiting, diarrhea, and constipation. She has no reports of blood in stools, dysuria and hematuria. No depression or anxiety reported. She denies pain today.   Review of Systems  Constitutional: Negative.   HENT: Negative.   Eyes: Negative.   Respiratory: Positive for cough.   Cardiovascular: Negative.   Gastrointestinal: Negative.   Endocrine: Negative.   Genitourinary: Negative.   Musculoskeletal: Negative.   Skin: Negative.   Allergic/Immunologic: Negative.   Neurological: Positive for dizziness and headaches.  Hematological: Negative.   Psychiatric/Behavioral: Negative.    Objective:   Physical Exam Constitutional:      Appearance: Normal appearance. She is normal weight.  HENT:     Head: Normocephalic and atraumatic.     Right Ear: Tympanic membrane, ear canal and external ear normal.     Left Ear: Tympanic membrane, ear canal and external ear normal.     Mouth/Throat:     Mouth: Mucous membranes are moist.  Eyes:     Extraocular Movements: Extraocular movements intact.     Conjunctiva/sclera:  Conjunctivae normal.     Pupils: Pupils are equal, round, and reactive to light.  Neck:     Musculoskeletal: Normal range of motion and neck supple.  Cardiovascular:     Rate and Rhythm: Normal rate and regular rhythm.     Pulses: Normal pulses.     Heart sounds: Normal heart sounds.  Pulmonary:     Effort: Pulmonary effort is normal.     Breath sounds: Normal breath sounds.  Abdominal:     General: Abdomen is flat. Bowel sounds are normal.     Palpations: Abdomen is soft.  Musculoskeletal: Normal range of motion.  Skin:    General: Skin is warm and dry.  Neurological:     Mental Status: She is alert.  Psychiatric:        Mood and Affect: Mood normal.        Behavior: Behavior normal.        Thought Content: Thought content normal.        Judgment: Judgment normal.    Assessment & Plan:   1. Accelerated hypertension Blood pressure levels are elevated today. Clonidine 0.2 mg given to patient at office. Advised patient to report to ED immediately for further assessment of blood pressure. She refuses. AMA signed and patient discharged. She will continue to decrease high sodium intake, excessive alcohol intake, increase potassium intake, smoking cessation, and increase physical activity of at least 30 minutes of cardio activity daily. She will continue to follow Heart Healthy or DASH diet.  2. Essential hypertension - cloNIDine (CATAPRES) tablet 0.2 mg -  atenolol (TENORMIN) 50 MG tablet; Take 1 tablet (50 mg total) by mouth daily.  Dispense: 30 tablet; Refill: 3  3. Gastroesophageal reflux disease without esophagitis We will initiate Pepcid today.  - famotidine (PEPCID) 40 MG tablet; Take 1 tablet (40 mg total) by mouth daily.  Dispense: 30 tablet; Refill: 3  4. Constipation, unspecified constipation type - lactulose (CHRONULAC) 10 GM/15ML solution; Take 15 mLs (10 g total) by mouth 3 (three) times daily.  Dispense: 437 mL; Refill: 11  5. Abnormal Urinalysis We will order Urine  Culture today.   6. Follow up She will follow up at our office in 1 week for re-assessment of compliance with antihypertensive medications.  - POCT urinalysis dipstick  Meds ordered this encounter  Medications  . cloNIDine (CATAPRES) tablet 0.2 mg  . famotidine (PEPCID) 40 MG tablet    Sig: Take 1 tablet (40 mg total) by mouth daily.    Dispense:  30 tablet    Refill:  3  . lactulose (CHRONULAC) 10 GM/15ML solution    Sig: Take 15 mLs (10 g total) by mouth 3 (three) times daily.    Dispense:  437 mL    Refill:  11  . atenolol (TENORMIN) 50 MG tablet    Sig: Take 1 tablet (50 mg total) by mouth daily.    Dispense:  30 tablet    Refill:  3    Raliegh IpNatalie Krisann Mckenna,  MSN, FNP-C [facility] [address]  [place], [place] [id number] [phone]

## 2018-02-24 ENCOUNTER — Ambulatory Visit (INDEPENDENT_AMBULATORY_CARE_PROVIDER_SITE_OTHER): Payer: Self-pay | Admitting: Family Medicine

## 2018-02-24 ENCOUNTER — Encounter: Payer: Self-pay | Admitting: Family Medicine

## 2018-02-24 VITALS — BP 140/90 | HR 68 | Temp 97.9°F | Ht 61.0 in | Wt 186.4 lb

## 2018-02-24 DIAGNOSIS — Z0130 Encounter for examination of blood pressure without abnormal findings: Secondary | ICD-10-CM

## 2018-03-15 ENCOUNTER — Other Ambulatory Visit: Payer: Self-pay | Admitting: Family Medicine

## 2018-03-15 MED FILL — ?CETIRIZINE HCL 10 MG TABLE: 10 | 30 days supply | Qty: 30 | Fill #2

## 2018-03-15 MED FILL — HYDROCHLOROTHIAZIDE 25 MG T: 25 | 30 days supply | Qty: 30 | Fill #1

## 2018-03-15 MED FILL — LISINOPRIL 40 MG TABLET: 40 | 30 days supply | Qty: 30 | Fill #1

## 2018-03-21 MED FILL — ATENOLOL 50 MG TABLET: 50 | 30 days supply | Qty: 30 | Fill #0

## 2018-05-03 MED FILL — ATENOLOL 50 MG TABLET: 50 | 30 days supply | Qty: 30 | Fill #1

## 2018-05-03 MED FILL — ?CETIRIZINE HCL 10 MG TABLE: 10 | 30 days supply | Qty: 30 | Fill #3

## 2018-05-03 MED FILL — LISINOPRIL 40 MG TABLET: 40 | 30 days supply | Qty: 30 | Fill #2

## 2018-05-19 ENCOUNTER — Other Ambulatory Visit: Payer: Self-pay

## 2018-05-19 ENCOUNTER — Ambulatory Visit: Payer: Self-pay

## 2018-05-22 ENCOUNTER — Encounter: Payer: Self-pay | Admitting: Family Medicine

## 2018-05-22 ENCOUNTER — Other Ambulatory Visit: Payer: Self-pay

## 2018-05-22 ENCOUNTER — Ambulatory Visit (INDEPENDENT_AMBULATORY_CARE_PROVIDER_SITE_OTHER): Payer: Self-pay | Admitting: Family Medicine

## 2018-05-22 VITALS — BP 156/82 | HR 70 | Temp 98.0°F | Ht 61.0 in | Wt 184.0 lb

## 2018-05-22 DIAGNOSIS — K029 Dental caries, unspecified: Secondary | ICD-10-CM | POA: Insufficient documentation

## 2018-05-22 DIAGNOSIS — R634 Abnormal weight loss: Secondary | ICD-10-CM | POA: Insufficient documentation

## 2018-05-22 DIAGNOSIS — Z09 Encounter for follow-up examination after completed treatment for conditions other than malignant neoplasm: Secondary | ICD-10-CM

## 2018-05-22 DIAGNOSIS — I10 Essential (primary) hypertension: Secondary | ICD-10-CM

## 2018-05-22 DIAGNOSIS — F419 Anxiety disorder, unspecified: Secondary | ICD-10-CM | POA: Insufficient documentation

## 2018-05-22 DIAGNOSIS — R072 Precordial pain: Secondary | ICD-10-CM

## 2018-05-22 MED ORDER — NITROGLYCERIN 0.4 MG SL SUBL: 0.4000 mg | SUBLINGUAL_TABLET | SUBLINGUAL | 11 refills | Status: AC | PRN

## 2018-05-22 NOTE — Progress Notes (Signed)
[facility]  Established Patient Office Visit  Subjective:  Patient ID: Jennifer Ayers, female    DOB: May 10, 1975  Age: 43 y.o. MRN: [id number]  CC:  Chief Complaint  Patient presents with  . Follow-up    Chronic condition   . Allergies  . Cough    HPI Jennifer Ayers is a 42 year old female who presents for follow up today.   Past Medical History:  Diagnosis Date  . Hypertension    Current Status: Since her last office visit, she is doing well with no complaints. She states that she has been having recent seasonal allergies symptoms for a few weeks now. She has been taking Zyrtec and using her Flonase nasal spray daily for relief of symptoms. She denies visual changes, chest pain, cough, shortness of breath, heart palpitations, and falls. She has occasional headaches and dizziness with position changes. Denies severe headaches, confusion, seizures, double vision, and blurred vision, nausea and vomiting. Her anxiety is stable today. She denies suicidal ideations, homicidal ideations, or auditory hallucinations.  She denies fevers, chills, fatigue, recent infections, weight loss, and night sweats.  No reports of GI problems such as nausea, vomiting, diarrhea, and constipation. She has no reports of blood in stools, dysuria and hematuria. She denies pain today.   Past Surgical History:  Procedure Laterality Date  . CESAREAN SECTION    . LEFT HEART CATH AND CORONARY ANGIOGRAPHY N/A 04/28/2017   Procedure: LEFT HEART CATH AND CORONARY ANGIOGRAPHY;  Surgeon: Corky Crafts, MD;  Location: [facility] INVASIVE CV LAB;  Service: Cardiovascular;  Laterality: N/A;  . TONSILLECTOMY      Family History  Problem Relation Age of Onset  . Diabetes Mother   . Hypertension Mother   . Diabetes Father   . Hypertension Father     Social History   Socioeconomic History  . Marital status: Single    Spouse name: Not on file  . Number of  children: Not on file  . Years of education: Not on file  . Highest education level: Not on file  Occupational History  . Not on file  Social Needs  . Financial resource strain: Not hard at all  . Food insecurity:    Worry: Never true    Inability: Never true  . Transportation needs:    Medical: No    Non-medical: No  Tobacco Use  . Smoking status: Current Every Day Smoker    Years: 5.00    Types: Cigarettes  . Smokeless tobacco: Never Used  Substance and Sexual Activity  . Alcohol use: No  . Drug use: No  . Sexual activity: Yes    Birth control/protection: None  Lifestyle  . Physical activity:    Days per week: 1 day    Minutes per session: 20 min  . Stress: Not at all  Relationships  . Social connections:    Talks on phone: Not on file    Gets together: Not on file    Attends religious service: Not on file    Active member of club or organization: Not on file    Attends meetings of clubs or organizations: Not on file    Relationship status: Not on file  . Intimate partner violence:    Fear of current or ex partner: No    Emotionally abused: No    Physically abused: No    Forced sexual activity: No  Other Topics Concern  . Not on file  Social History Narrative  . Not on file    Outpatient Medications Prior to Visit  Medication Sig Dispense Refill  . acetaminophen (TYLENOL) 500 MG tablet Take 1 tablet (500 mg total) by mouth every 6 (six) hours as needed. (Patient taking differently: Take 500 mg by mouth every 6 (six) hours as needed for moderate pain or headache. ) 30 tablet 0  . aspirin EC 81 MG tablet Take 1 tablet (81 mg total) by mouth daily. 90 tablet 2  . atenolol (TENORMIN) 50 MG tablet Take 1 tablet (50 mg total) by mouth daily. 30 tablet 3  . atorvastatin (LIPITOR) 20 MG tablet Take 1 tablet (20 mg total) by mouth daily. 90 tablet 3  . buPROPion (WELLBUTRIN SR) 150 MG 12 hr tablet Take 1 tablet (150 mg total) by mouth 2 (two) times daily. 180 tablet 1   . cetirizine (ZYRTEC) 10 MG tablet TAKE 1 TABLET BY MOUTH DAILY. 30 tablet 11  . Chlorphen-Phenyleph-APAP (CORICIDIN D COLD/FLU/SINUS) 2-5-325 MG TABS Take 1 tablet by mouth daily as needed (cold symptoms).    . cloNIDine (CATAPRES) 0.1 MG tablet Take 1 tablet (0.1 mg total) by mouth 3 (three) times daily as needed. Take only if BP > 140/90 90 tablet 1  . etonogestrel (NEXPLANON) 68 MG IMPL implant 1 each by Subdermal route once.    . famotidine (PEPCID) 40 MG tablet Take 1 tablet (40 mg total) by mouth daily. 30 tablet 3  . hydrochlorothiazide (HYDRODIURIL) 25 MG tablet TAKE 1 TABLET BY MOUTH DAILY 90 tablet 3  . lactulose (CHRONULAC) 10 GM/15ML solution Take 15 mLs (10 g total) by mouth 3 (three) times daily. 437 mL 11  . lisinopril (PRINIVIL,ZESTRIL) 40 MG tablet TAKE 1 TABLET BY MOUTH DAILY. 90 tablet 1  . ondansetron (ZOFRAN ODT) 4 MG disintegrating tablet Take 1 tablet (4 mg total) by mouth every 8 (eight) hours as needed for nausea or vomiting. 15 tablet 0  . Sennosides (LAXATIVE) 25 MG TABS Take 1 tablet by mouth daily as needed (constipation).    . nitroGLYCERIN (NITROSTAT) 0.4 MG SL tablet Place 0.4 mg under the tongue every 5 (five) minutes as needed for chest pain.    . nitroGLYCERIN (NITROSTAT) 0.4 MG SL tablet Place 1 tablet (0.4 mg total) under the tongue every 5 (five) minutes as needed for chest pain. 25 tablet 11   No facility-administered medications prior to visit.     Allergies  Allergen Reactions  . Amlodipine Other (See Comments)    Really severe headaches     ROS Review of Systems  Constitutional: Negative.   HENT: Negative.   Eyes: Negative.   Respiratory: Negative.   Cardiovascular: Negative.   Gastrointestinal: Negative.   Endocrine: Negative.   Genitourinary: Negative.   Musculoskeletal: Negative.   Skin: Negative.   Allergic/Immunologic:       Seasonal allergies   Neurological: Positive for dizziness and headaches.  Hematological: Negative.    Psychiatric/Behavioral: Negative.    Objective:    Physical Exam  Constitutional: She is oriented to person, place, and time. She appears well-developed and well-nourished.  HENT:  Head: Normocephalic and atraumatic.  Eyes: Conjunctivae are normal.  Neck: Normal range of motion. Neck supple.  Cardiovascular: Normal rate, regular rhythm and normal heart sounds.  Pulmonary/Chest: Effort normal and breath sounds normal.  Abdominal: Soft. Bowel sounds are normal.  Musculoskeletal: Normal range of motion.  Neurological: She is alert and oriented to person, place, and time. She has normal reflexes.  Skin: Skin is warm and dry.  Psychiatric: She has a normal mood and affect. Her behavior is normal. Judgment and thought content normal.  Nursing note and vitals reviewed.  BP (!) 156/82   Pulse 70   Temp 98 F (36.7 C) (Oral)   Ht  (1.549 m)   Wt 184 lb (83.5 kg)   SpO2 100%   BMI 34.77 kg/m  Wt Readings from Last 3 Encounters:  05/22/18 184 lb (83.5 kg)  02/24/18 186 lb 6.4 oz (84.6 kg)  02/20/18 191 lb (86.6 kg)     Health Maintenance Due  Topic Date Due  . PAP SMEAR-Modifier  07/02/2017    There are no preventive care reminders to display for this patient.  Lab Results  Component Value Date   TSH 1.410 04/18/2017   Lab Results  Component Value Date   WBC 7.9 02/12/2018   HGB 16.4 (H) 02/12/2018   HCT 50.4 (H) 02/12/2018   MCV 93.7 02/12/2018   PLT 322 02/12/2018   Lab Results  Component Value Date   NA 139 02/12/2018   K 3.8 02/12/2018   CO2 25 02/12/2018   GLUCOSE 89 02/12/2018   BUN 14 02/12/2018   CREATININE 1.33 (H) 02/12/2018   BILITOT 0.7 02/12/2018   ALKPHOS 75 02/12/2018   AST 15 02/12/2018   ALT 15 02/12/2018   PROT 7.7 02/12/2018   ALBUMIN 4.0 02/12/2018   CALCIUM 9.2 02/12/2018   ANIONGAP 7 02/12/2018   Lab Results  Component Value Date   CHOL 196 04/18/2017   Lab Results  Component Value Date   HDL 40 04/18/2017   Lab Results   Component Value Date   LDLCALC 132 (H) 04/18/2017   Lab Results  Component Value Date   TRIG 122 04/18/2017   Lab Results  Component Value Date   CHOLHDL 4.9 (H) 04/18/2017   Lab Results  Component Value Date   HGBA1C 5.2 12/06/2017      Assessment & Plan:   1. Essential hypertension Blood pressures are increased today. She took all antihypertensive medications this morning. She will return for blood pressure recheck in 2 weeks. She will continue to decrease high sodium intake, excessive alcohol intake, increase potassium intake, smoking cessation, and increase physical activity of at least 30 minutes of cardio activity daily. She will continue to follow Heart Healthy or DASH diet. - POCT urinalysis dipstick - nitroGLYCERIN (NITROSTAT) 0.4 MG SL tablet; Place 1 tablet (0.4 mg total) under the tongue every 5 (five) minutes as needed for chest pain.  Dispense: 100 tablet; Refill: 11  2. Anxiety Stable today. Continue Wellbutrin as prescribed.   3. Weight decrease Stable. She has a 2 lb weight decrease in 3 months. Body mass index is 34.77 kg/m. Goal BMI  is <30. Encouraged efforts to reduce weight include engaging in physical activity as tolerated with goal of 150 minutes per week. Improve dietary choices and eat a meal regimen consistent with a Mediterranean or DASH diet. Reduce simple carbohydrates. Do not skip meals and eat healthy snacks throughout the day to avoid over-eating at dinner. Set a goal weight loss that is achievable for you.  4. Precordial pain Stable today. No recent chest pain. She will take Nitrostat as needed.  - nitroGLYCERIN (NITROSTAT) 0.4 MG SL tablet; Place 1 tablet (0.4 mg total) under the tongue every 5 (five) minutes as needed for chest pain.  Dispense: 100 tablet; Refill: 11  5. Dental caries - Ambulatory referral to Dentistry  6. Follow up She will follow up for blood pressure recheck in 2 weeks. She will follow up for office visit in 3 months.     Problem List Items Addressed This Visit      Cardiovascular and Mediastinum   HTN (hypertension) - Primary (Chronic)   Relevant Medications   nitroGLYCERIN (NITROSTAT) 0.4 MG SL tablet   Other Relevant Orders   POCT urinalysis dipstick     Other   Precordial pain   Relevant Medications   nitroGLYCERIN (NITROSTAT) 0.4 MG SL tablet    Other Visit Diagnoses    Anxiety       Weight decrease       Dental caries       Relevant Orders   Ambulatory referral to Dentistry   Follow up          Meds ordered this encounter  Medications  . nitroGLYCERIN (NITROSTAT) 0.4 MG SL tablet    Sig: Place 1 tablet (0.4 mg total) under the tongue every 5 (five) minutes as needed for chest pain.    Dispense:  100 tablet    Refill:  11    Follow-up: No follow-ups on file.    Kallie Locks, FNP

## 2018-05-26 ENCOUNTER — Ambulatory Visit: Payer: Medicaid Other | Admitting: Family Medicine

## 2018-06-14 MED FILL — LISINOPRIL 40 MG TABLET: 40 | 30 days supply | Qty: 30 | Fill #3

## 2018-06-14 MED FILL — ?ATENOLOL 50 MG TABLET: 50 | 60 days supply | Qty: 60 | Fill #2

## 2018-06-20 ENCOUNTER — Other Ambulatory Visit: Payer: Self-pay

## 2018-06-20 MED ORDER — ATORVASTATIN CALCIUM 20 MG PO TABS: 20.0000 mg | ORAL_TABLET | Freq: Every day | ORAL | 3 refills | Status: DC

## 2018-06-20 MED FILL — ?ATORVASTATIN 20 MG TABLET: 20 | 90 days supply | Qty: 90 | Fill #0

## 2018-06-20 NOTE — Telephone Encounter (Signed)
Medication has been sent to pharmacy.  °

## 2018-07-19 MED FILL — LISINOPRIL 40 MG TABLET: 40 | 30 days supply | Qty: 30 | Fill #4

## 2018-08-21 ENCOUNTER — Other Ambulatory Visit: Payer: Self-pay | Admitting: Family Medicine

## 2018-08-21 ENCOUNTER — Telehealth: Payer: Self-pay

## 2018-08-21 DIAGNOSIS — I10 Essential (primary) hypertension: Secondary | ICD-10-CM

## 2018-08-21 MED ORDER — CLONIDINE HCL 0.1 MG PO TABS: 0.1000 mg | ORAL_TABLET | Freq: Three times a day (TID) | ORAL | 1 refills | Status: DC | PRN

## 2018-08-21 MED FILL — ?CLONIDINE HCL 0.1 MG TABL: 0.1 | 30 days supply | Qty: 90 | Fill #0

## 2018-08-21 MED FILL — LISINOPRIL 40 MG TABLET: 40 | 30 days supply | Qty: 30 | Fill #5

## 2018-08-21 MED FILL — ?HYDROCHLOROTHIAZIDE 25MG T: 25 | 30 days supply | Qty: 30 | Fill #2

## 2018-08-21 NOTE — Telephone Encounter (Signed)
Tried to contact patient and call wouldn't go through  

## 2018-08-22 ENCOUNTER — Ambulatory Visit: Payer: No Typology Code available for payment source | Admitting: Family Medicine

## 2018-08-23 ENCOUNTER — Other Ambulatory Visit: Payer: Self-pay

## 2018-08-23 DIAGNOSIS — I10 Essential (primary) hypertension: Secondary | ICD-10-CM

## 2018-08-23 MED ORDER — ATENOLOL 50 MG PO TABS: 50.0000 mg | ORAL_TABLET | Freq: Every day | ORAL | 1 refills | Status: DC

## 2018-08-23 MED FILL — ?ATENOLOL 50 MG TABLET: 50 | 30 days supply | Qty: 30 | Fill #0

## 2018-08-23 NOTE — Telephone Encounter (Signed)
Medication sent to pharmacy  

## 2018-08-29 ENCOUNTER — Telehealth: Payer: Self-pay

## 2018-08-30 NOTE — Telephone Encounter (Signed)
Patient will schedule a follow up appointment.  

## 2018-09-11 ENCOUNTER — Encounter: Payer: Self-pay | Admitting: Family Medicine

## 2018-09-11 ENCOUNTER — Other Ambulatory Visit: Payer: Self-pay

## 2018-09-11 ENCOUNTER — Ambulatory Visit (INDEPENDENT_AMBULATORY_CARE_PROVIDER_SITE_OTHER): Payer: Self-pay | Admitting: Family Medicine

## 2018-09-11 VITALS — BP 152/86 | HR 70 | Temp 98.0°F | Ht 61.0 in | Wt 190.4 lb

## 2018-09-11 DIAGNOSIS — F419 Anxiety disorder, unspecified: Secondary | ICD-10-CM

## 2018-09-11 DIAGNOSIS — E66812 Obesity, class 2: Secondary | ICD-10-CM

## 2018-09-11 DIAGNOSIS — Z789 Other specified health status: Secondary | ICD-10-CM

## 2018-09-11 DIAGNOSIS — Z6835 Body mass index (BMI) 35.0-35.9, adult: Secondary | ICD-10-CM

## 2018-09-11 DIAGNOSIS — K5900 Constipation, unspecified: Secondary | ICD-10-CM

## 2018-09-11 DIAGNOSIS — I10 Essential (primary) hypertension: Secondary | ICD-10-CM

## 2018-09-11 DIAGNOSIS — Z09 Encounter for follow-up examination after completed treatment for conditions other than malignant neoplasm: Secondary | ICD-10-CM

## 2018-09-11 LAB — POCT URINALYSIS DIP (MANUAL ENTRY)
Bilirubin, UA: NEGATIVE
Glucose, UA: NEGATIVE mg/dL
Ketones, POC UA: NEGATIVE mg/dL
Leukocytes, UA: NEGATIVE
Nitrite, UA: NEGATIVE
Protein Ur, POC: 100 mg/dL — AB
Spec Grav, UA: 1.025 (ref 1.010–1.025)
Urobilinogen, UA: 1 E.U./dL
pH, UA: 6 (ref 5.0–8.0)

## 2018-09-11 MED ORDER — LACTULOSE 10 GM/15ML PO SOLN: 10.0000 g | Freq: Three times a day (TID) | ORAL | 11 refills | Status: DC

## 2018-09-11 MED FILL — LACTULOSE 10 GM/15 ML SOLN: 10 | 10 days supply | Qty: 473 | Fill #0

## 2018-09-11 NOTE — Progress Notes (Signed)
[facility]   Established Patient Office Visit  Subjective:  Patient ID: Jennifer Ayers, female    DOB: [birth year]  Age: 43 y.o. MRN: [id number]  CC:  Chief Complaint  Patient presents with  . Follow-up    HTN    HPI Jennifer Ayers is a 43 year old female who presents for follow up today.   Past Medical History:  Diagnosis Date  . Hypertension    Current Status: Since her last office visit, she is doing well with no complaints. She denies visual changes, chest pain, cough, shortness of breath, heart palpitations, and falls. She has occasional headaches and dizziness with position changes. Denies severe headaches, confusion, seizures, double vision, and blurred vision, nausea and vomiting. She states that she does have breakthrough bleeding while continuing Nexplanon. She is currently having her menstrual cycle. Her anxiety is mild today. She denies suicidal ideations, homicidal ideations, or auditory hallucinations. She has not been following her diet as in the past.   She denies fevers, chills, fatigue, recent infections, weight loss, and night sweats. No reports of GI problems such as diarrhea, and constipation. She has no reports of blood in stools, dysuria and hematuria. She denies pain today.   Past Surgical History:  Procedure Laterality Date  . CESAREAN SECTION    . LEFT HEART CATH AND CORONARY ANGIOGRAPHY N/A 04/28/2017   Procedure: LEFT HEART CATH AND CORONARY ANGIOGRAPHY;  Surgeon: Corky CraftsVaranasi, Jayadeep S, MD;  [facility];  Service: Cardiovascular;  Laterality: N/A;  . TONSILLECTOMY      Family History  Problem Relation Age of Onset  . Diabetes Mother   . Hypertension Mother   . Diabetes Father   . Hypertension Father     Social History   Socioeconomic History  . Marital status: Single    Spouse name: Not on file  . Number of children: Not on file  . Years of education: Not on file  . Highest  education level: Not on file  Occupational History  . Not on file  Social Needs  . Financial resource strain: Not hard at all  . Food insecurity    Worry: Never true    Inability: Never true  . Transportation needs    Medical: No    Non-medical: No  Tobacco Use  . Smoking status: Current Every Day Smoker    Years: 5.00    Types: Cigarettes  . Smokeless tobacco: Never Used  Substance and Sexual Activity  . Alcohol use: No  . Drug use: No  . Sexual activity: Yes    Birth control/protection: None  Lifestyle  . Physical activity    Days per week: 1 day    Minutes per session: 20 min  . Stress: Not at all  Relationships  . Social Musicianconnections    Talks on phone: Not on file    Gets together: Not on file    Attends religious service: Not on file    Active member of club or organization: Not on file    Attends meetings of clubs or organizations: Not on file    Relationship status: Not on file  . Intimate partner violence    Fear of current or ex partner: No    Emotionally abused: No    Physically abused: No    Forced sexual activity: No  Other Topics Concern  . Not on file  Social History Narrative  . Not on file  Outpatient Medications Prior to Visit  Medication Sig Dispense Refill  . acetaminophen (TYLENOL) 500 MG tablet Take 1 tablet (500 mg total) by mouth every 6 (six) hours as needed. (Patient taking differently: Take 500 mg by mouth every 6 (six) hours as needed for moderate pain or headache. ) 30 tablet 0  . aspirin EC 81 MG tablet Take 1 tablet (81 mg total) by mouth daily. 90 tablet 2  . atenolol (TENORMIN) 50 MG tablet Take 1 tablet (50 mg total) by mouth daily. 30 tablet 1  . atorvastatin (LIPITOR) 20 MG tablet Take 1 tablet (20 mg total) by mouth daily. 90 tablet 3  . buPROPion (WELLBUTRIN SR) 150 MG 12 hr tablet Take 1 tablet (150 mg total) by mouth 2 (two) times daily. 180 tablet 1  . cetirizine (ZYRTEC) 10 MG tablet TAKE 1 TABLET BY MOUTH DAILY. 30 tablet  11  . cloNIDine (CATAPRES) 0.1 MG tablet Take 1 tablet (0.1 mg total) by mouth 3 (three) times daily as needed. Take only if BP > 140/90 90 tablet 1  . etonogestrel (NEXPLANON) 68 MG IMPL implant 1 each by Subdermal route once.    . hydrochlorothiazide (HYDRODIURIL) 25 MG tablet TAKE 1 TABLET BY MOUTH DAILY 90 tablet 3  . lactulose (CHRONULAC) 10 GM/15ML solution Take 15 mLs (10 g total) by mouth 3 (three) times daily. 437 mL 11  . lisinopril (PRINIVIL,ZESTRIL) 40 MG tablet TAKE 1 TABLET BY MOUTH DAILY. 90 tablet 1  . nitroGLYCERIN (NITROSTAT) 0.4 MG SL tablet Place 1 tablet (0.4 mg total) under the tongue every 5 (five) minutes as needed for chest pain. 100 tablet 11  . ondansetron (ZOFRAN ODT) 4 MG disintegrating tablet Take 1 tablet (4 mg total) by mouth every 8 (eight) hours as needed for nausea or vomiting. 15 tablet 0  . Sennosides (LAXATIVE) 25 MG TABS Take 1 tablet by mouth daily as needed (constipation).    . Chlorphen-Phenyleph-APAP (CORICIDIN D COLD/FLU/SINUS) 2-5-325 MG TABS Take 1 tablet by mouth daily as needed (cold symptoms).    . famotidine (PEPCID) 40 MG tablet Take 1 tablet (40 mg total) by mouth daily. (Patient not taking: Reported on 09/11/2018) 30 tablet 3   No facility-administered medications prior to visit.     Allergies  Allergen Reactions  . Amlodipine Other (See Comments)    Really severe headaches     ROS Review of Systems  Constitutional: Negative.   HENT: Negative.   Eyes: Negative.   Respiratory: Negative.   Cardiovascular: Negative.   Gastrointestinal: Negative.   Endocrine: Negative.   Genitourinary: Negative.   Musculoskeletal: Negative.   Skin: Negative.   Allergic/Immunologic: Negative.   Neurological: Positive for dizziness (occasional) and headaches (occasional).  Hematological: Negative.   Psychiatric/Behavioral: Negative.    Objective:    Physical Exam  Constitutional: She is oriented to person, place, and time. She appears  well-developed and well-nourished.  HENT:  Head: Normocephalic and atraumatic.  Eyes: Conjunctivae are normal.  Neck: Normal range of motion. Neck supple.  Cardiovascular: Normal rate, regular rhythm, normal heart sounds and intact distal pulses.  Pulmonary/Chest: Effort normal and breath sounds normal.  Abdominal: Soft. Bowel sounds are normal.  Musculoskeletal: Normal range of motion.  Neurological: She is alert and oriented to person, place, and time. She has normal reflexes.  Skin: Skin is warm and dry.  Psychiatric: She has a normal mood and affect. Her behavior is normal. Judgment and thought content normal.  Nursing note and vitals reviewed.  BP (!) 152/86   Pulse 70   Temp 98 F (36.7 C) (Oral)   Ht 5\' 1"  (1.549 m)   Wt 190 lb 6.4 oz (86.4 kg)   LMP 09/04/2018   SpO2 99%   BMI 35.98 kg/m  Wt Readings from Last 3 Encounters:  09/11/18 190 lb 6.4 oz (86.4 kg)  05/22/18 184 lb (83.5 kg)  02/24/18 186 lb 6.4 oz (84.6 kg)     Health Maintenance Due  Topic Date Due  . PAP SMEAR-Modifier  07/02/2017    There are no preventive care reminders to display for this patient.  Lab Results  Component Value Date   TSH 1.410 04/18/2017   Lab Results  Component Value Date   WBC 7.9 02/12/2018   HGB 16.4 (H) 02/12/2018   HCT 50.4 (H) 02/12/2018   MCV 93.7 02/12/2018   PLT 322 02/12/2018   Lab Results  Component Value Date   NA 139 02/12/2018   K 3.8 02/12/2018   CO2 25 02/12/2018   GLUCOSE 89 02/12/2018   BUN 14 02/12/2018   CREATININE 1.33 (H) 02/12/2018   BILITOT 0.7 02/12/2018   ALKPHOS 75 02/12/2018   AST 15 02/12/2018   ALT 15 02/12/2018   PROT 7.7 02/12/2018   ALBUMIN 4.0 02/12/2018   CALCIUM 9.2 02/12/2018   ANIONGAP 7 02/12/2018   Lab Results  Component Value Date   CHOL 196 04/18/2017   Lab Results  Component Value Date   HDL 40 04/18/2017   Lab Results  Component Value Date   LDLCALC 132 (H) 04/18/2017   Lab Results  Component Value  Date   TRIG 122 04/18/2017   Lab Results  Component Value Date   CHOLHDL 4.9 (H) 04/18/2017   Lab Results  Component Value Date   HGBA1C 5.2 12/06/2017   Assessment & Plan:   1. Essential hypertension Blood pressure at 152/86 today. We will assess if addition hypertensive medication should be added to patient regimen. She will continue to decrease high sodium intake, excessive alcohol intake, increase potassium intake, smoking cessation, and increase physical activity of at least 30 minutes of cardio activity daily. She will continue to follow Heart Healthy or DASH diet. - POCT urinalysis dipstick  2. Uses birth control Nexplanon.  3. Anxiety  4. Class 2 severe obesity due to excess calories with serious comorbidity and body mass index (BMI) of 35.0 to 35.9 in adult [facility](HCC) Body mass index is 35.98 kg/m. Goal BMI  is <30. Encouraged efforts to reduce weight include engaging in physical activity as tolerated with goal of 150 minutes per week. Improve dietary choices and eat a meal regimen consistent with a Mediterranean or DASH diet. Reduce simple carbohydrates. Do not skip meals and eat healthy snacks throughout the day to avoid over-eating at dinner. Set a goal weight loss that is achievable for you.  5. Follow up She will follow up in 6 months.   No orders of the defined types were placed in this encounter.   Orders Placed This Encounter  Procedures  . POCT urinalysis dipstick    Referral Orders  No referral(s) requested today    Raliegh IpNatalie Franke Menter,  MSN, FNP-BC [facility] [address]  [place], [place] [id number] [phone]   Problem List Items Addressed This Visit      Cardiovascular and Mediastinum   HTN (hypertension) - Primary (Chronic)   Relevant Orders   POCT urinalysis dipstick (Completed)     Other  Anxiety    Other Visit Diagnoses    Uses birth control       Class 2 severe obesity due to excess calories with  serious comorbidity and body mass index (BMI) of 35.0 to 35.9 in adult [facility](HCC)       Follow up          No orders of the defined types were placed in this encounter.   Follow-up: No follow-ups on file.    Kallie LocksNatalie M Bernardo Brayman, FNP

## 2018-09-13 ENCOUNTER — Other Ambulatory Visit: Payer: Self-pay | Admitting: Family Medicine

## 2018-09-27 ENCOUNTER — Other Ambulatory Visit: Payer: Self-pay | Admitting: Family Medicine

## 2018-09-27 DIAGNOSIS — I10 Essential (primary) hypertension: Secondary | ICD-10-CM

## 2018-09-27 MED FILL — ?ATENOLOL 50 MG TABLET: 50 | 30 days supply | Qty: 30 | Fill #1

## 2018-09-28 MED FILL — LISINOPRIL 40 MG TABLET: 40 | 30 days supply | Qty: 30 | Fill #0

## 2018-11-03 ENCOUNTER — Other Ambulatory Visit: Payer: Self-pay | Admitting: Family Medicine

## 2018-11-03 DIAGNOSIS — I10 Essential (primary) hypertension: Secondary | ICD-10-CM

## 2018-11-03 MED FILL — LISINOPRIL 40 MG TABLET: 40 | 30 days supply | Qty: 30 | Fill #1

## 2018-11-03 MED FILL — ?ATENOLOL 50 MG TABLET: 50 | 30 days supply | Qty: 30 | Fill #0

## 2018-12-11 MED FILL — cloNIDine HCL 0.1 MG TABS: 0.1 | 30 days supply | Qty: 90 | Fill #1

## 2018-12-11 MED FILL — ?ATENOLOL 50 MG TABLET: 50 | 30 days supply | Qty: 30 | Fill #1

## 2018-12-11 MED FILL — ?HYDROCHLOROTHIAZIDE 25MG T: 25 | 30 days supply | Qty: 30 | Fill #3

## 2018-12-11 MED FILL — LISINOPRIL 40 MG TABLET: 40 | 30 days supply | Qty: 30 | Fill #2

## 2018-12-20 ENCOUNTER — Other Ambulatory Visit: Payer: Self-pay

## 2018-12-20 ENCOUNTER — Ambulatory Visit: Payer: No Typology Code available for payment source

## 2018-12-20 DIAGNOSIS — Z20822 Contact with and (suspected) exposure to covid-19: Secondary | ICD-10-CM

## 2018-12-22 LAB — NOVEL CORONAVIRUS, NAA: SARS-CoV-2, NAA: NOT DETECTED

## 2018-12-27 ENCOUNTER — Ambulatory Visit: Payer: No Typology Code available for payment source

## 2019-01-23 ENCOUNTER — Other Ambulatory Visit: Payer: Self-pay | Admitting: Family Medicine

## 2019-01-23 ENCOUNTER — Other Ambulatory Visit: Payer: Self-pay | Admitting: Internal Medicine

## 2019-01-23 DIAGNOSIS — I10 Essential (primary) hypertension: Secondary | ICD-10-CM

## 2019-01-23 MED FILL — LISINOPRIL 40 MG TABLET: 40 | 30 days supply | Qty: 30 | Fill #3

## 2019-01-23 MED FILL — ?ATENOLOL 50 MG TABLET: 50 | 30 days supply | Qty: 30 | Fill #0

## 2019-01-24 MED FILL — ?CETIRIZINE HCL 10 MG TABLE: 10 | 30 days supply | Qty: 30 | Fill #0

## 2019-02-28 ENCOUNTER — Ambulatory Visit: Payer: Medicaid Other | Attending: Internal Medicine

## 2019-02-28 DIAGNOSIS — Z20822 Contact with and (suspected) exposure to covid-19: Secondary | ICD-10-CM

## 2019-02-28 MED FILL — ?CETIRIZINE HCL 10 MG TABLE: 10 | 30 days supply | Qty: 30 | Fill #1

## 2019-02-28 MED FILL — LISINOPRIL 40 MG TABLET: 40 | 30 days supply | Qty: 30 | Fill #4

## 2019-02-28 MED FILL — ?ATENOLOL 50 MG TABLET: 50 | 30 days supply | Qty: 30 | Fill #1

## 2019-03-01 LAB — NOVEL CORONAVIRUS, NAA: SARS-CoV-2, NAA: NOT DETECTED

## 2019-03-14 ENCOUNTER — Encounter: Payer: Self-pay | Admitting: Family Medicine

## 2019-03-14 ENCOUNTER — Other Ambulatory Visit: Payer: Self-pay

## 2019-03-14 ENCOUNTER — Ambulatory Visit (INDEPENDENT_AMBULATORY_CARE_PROVIDER_SITE_OTHER): Payer: Self-pay | Admitting: Family Medicine

## 2019-03-14 VITALS — BP 179/99 | HR 69 | Temp 97.7°F | Ht 61.0 in | Wt 195.4 lb

## 2019-03-14 DIAGNOSIS — Z09 Encounter for follow-up examination after completed treatment for conditions other than malignant neoplasm: Secondary | ICD-10-CM

## 2019-03-14 DIAGNOSIS — Z6835 Body mass index (BMI) 35.0-35.9, adult: Secondary | ICD-10-CM

## 2019-03-14 DIAGNOSIS — Z0000 Encounter for general adult medical examination without abnormal findings: Secondary | ICD-10-CM

## 2019-03-14 DIAGNOSIS — E66812 Obesity, class 2: Secondary | ICD-10-CM

## 2019-03-14 DIAGNOSIS — I10 Essential (primary) hypertension: Secondary | ICD-10-CM

## 2019-03-14 DIAGNOSIS — F419 Anxiety disorder, unspecified: Secondary | ICD-10-CM

## 2019-03-14 LAB — POCT GLYCOSYLATED HEMOGLOBIN (HGB A1C): Hemoglobin A1C: 5.7 % — AB (ref 4.0–5.6)

## 2019-03-14 LAB — POCT URINALYSIS DIPSTICK
Bilirubin, UA: NEGATIVE
Glucose, UA: NEGATIVE
Ketones, UA: NEGATIVE
Leukocytes, UA: NEGATIVE
Nitrite, UA: NEGATIVE
Protein, UA: NEGATIVE
Spec Grav, UA: 1.02 (ref 1.010–1.025)
Urobilinogen, UA: 0.2 E.U./dL
pH, UA: 6 (ref 5.0–8.0)

## 2019-03-14 LAB — GLUCOSE, POCT (MANUAL RESULT ENTRY): POC Glucose: 78 mg/dl (ref 70–99)

## 2019-03-14 MED ORDER — LISINOPRIL 40 MG PO TABS: 40.0000 mg | ORAL_TABLET | Freq: Every day | ORAL | 6 refills | Status: DC

## 2019-03-14 MED ORDER — BUPROPION HCL ER (SR) 150 MG PO TB12: 150.0000 mg | ORAL_TABLET | Freq: Two times a day (BID) | ORAL | 6 refills | Status: DC

## 2019-03-14 MED ORDER — CLONIDINE HCL 0.1 MG PO TABS: 0.1000 mg | ORAL_TABLET | Freq: Three times a day (TID) | ORAL | 6 refills | Status: DC | PRN

## 2019-03-14 MED ORDER — ATENOLOL 50 MG PO TABS: 50.0000 mg | ORAL_TABLET | Freq: Every day | ORAL | 6 refills | Status: DC

## 2019-03-14 MED ORDER — ATORVASTATIN CALCIUM 20 MG PO TABS: 20.0000 mg | ORAL_TABLET | Freq: Every day | ORAL | 6 refills | Status: DC

## 2019-03-14 MED ORDER — HYDROCHLOROTHIAZIDE 25 MG PO TABS: 25.0000 mg | ORAL_TABLET | Freq: Every day | ORAL | 6 refills | Status: DC

## 2019-03-14 MED FILL — cloNIDine HCL 0.1 MG TABS: 0.1 | 10 days supply | Qty: 30 | Fill #0

## 2019-03-14 MED FILL — ?HYDROCHLOROTHIAZIDE 25MG T: 25 | 30 days supply | Qty: 30 | Fill #0

## 2019-03-14 MED FILL — BUPROPION SR 150 MG TABLET: 150 | 30 days supply | Qty: 60 | Fill #0

## 2019-03-14 MED FILL — ?ATORVASTATIN 20 MG TABLET: 20 | 30 days supply | Qty: 30 | Fill #0

## 2019-03-14 NOTE — Progress Notes (Signed)
[facility]   Established Patient Office Visit  Subjective:  Patient ID: Jennifer Ayers, female    DOB: Aug 29, 1975  Age: 44 y.o. MRN: [id number]  CC:  Chief Complaint  Patient presents with  . Follow-up    HTN  . WORK NOTE    Ok to RTW    HPI Jennifer Ayers is a 43 year old female who presents for Follow Up today.   Past Medical History:  Diagnosis Date  . Hypertension    Current Status: Since her last office visit, she is doing well with no complaints. Her blood pressures are elevated today. She admits to drinking coffee prior to her office visit today. She has not been taking medications as prescribed. She denies visual changes, chest pain, cough, shortness of breath, heart palpitations, and falls. She has occasional headaches and dizziness with position changes. Denies severe headaches, confusion, seizures, double vision, and blurred vision, nausea and vomiting. She denies fevers, chills, fatigue, recent infections, weight loss, and night sweats.  No reports of GI problems such as nausea, vomiting, diarrhea, and constipation. She has no reports of blood in stools, dysuria and hematuria. No depression or anxiety, and denies suicidal ideations, homicidal ideations, or auditory hallucinations. She denies pain today.   Past Surgical History:  Procedure Laterality Date  . CESAREAN SECTION    . LEFT HEART CATH AND CORONARY ANGIOGRAPHY N/A 04/28/2017   Procedure: LEFT HEART CATH AND CORONARY ANGIOGRAPHY;  Surgeon: Corky Crafts, MD;  Location: [facility] INVASIVE CV LAB;  Service: Cardiovascular;  Laterality: N/A;  . TONSILLECTOMY      Family History  Problem Relation Age of Onset  . Diabetes Mother   . Hypertension Mother   . Diabetes Father   . Hypertension Father     Social History   Socioeconomic History  . Marital status: Single    Spouse name: Not on file  . Number of children: Not on file  . Years of education: Not  on file  . Highest education level: Not on file  Occupational History  . Not on file  Tobacco Use  . Smoking status: Current Every Day Smoker    Years: 5.00    Types: Cigarettes  . Smokeless tobacco: Never Used  Substance and Sexual Activity  . Alcohol use: No  . Drug use: No  . Sexual activity: Yes    Birth control/protection: None  Other Topics Concern  . Not on file  Social History Narrative  . Not on file   Social Determinants of Health   Financial Resource Strain:   . Difficulty of Paying Living Expenses: Not on file  Food Insecurity:   . Worried About Programme researcher, broadcasting/film/video in the Last Year: Not on file  . Ran Out of Food in the Last Year: Not on file  Transportation Needs:   . Lack of Transportation (Medical): Not on file  . Lack of Transportation (Non-Medical): Not on file  Physical Activity:   . Days of Exercise per Week: Not on file  . Minutes of Exercise per Session: Not on file  Stress:   . Feeling of Stress : Not on file  Social Connections:   . Frequency of Communication with Friends and Family: Not on file  . Frequency of Social Gatherings with Friends and Family: Not on file  . Attends Religious Services: Not on file  . Active Member of Clubs or Organizations: Not on file  . Attends Club or  Organization Meetings: Not on file  . Marital Status: Not on file  Intimate Partner Violence:   . Fear of Current or Ex-Partner: Not on file  . Emotionally Abused: Not on file  . Physically Abused: Not on file  . Sexually Abused: Not on file    Outpatient Medications Prior to Visit  Medication Sig Dispense Refill  . acetaminophen (TYLENOL) 500 MG tablet Take 1 tablet (500 mg total) by mouth every 6 (six) hours as needed. (Patient taking differently: Take 500 mg by mouth every 6 (six) hours as needed for moderate pain or headache. ) 30 tablet 0  . cetirizine (ZYRTEC) 10 MG tablet TAKE 1 TABLET BY MOUTH DAILY. 30 tablet 11  . etonogestrel (NEXPLANON) 68 MG IMPL  implant 1 each by Subdermal route once.    . lactulose (CHRONULAC) 10 GM/15ML solution Take 15 mLs (10 g total) by mouth 3 (three) times daily. 437 mL 11  . atenolol (TENORMIN) 50 MG tablet TAKE 1 TABLET (50 MG TOTAL) BY MOUTH DAILY. 30 tablet 1  . atorvastatin (LIPITOR) 20 MG tablet Take 1 tablet (20 mg total) by mouth daily. 90 tablet 3  . buPROPion (WELLBUTRIN SR) 150 MG 12 hr tablet Take 1 tablet (150 mg total) by mouth 2 (two) times daily. 180 tablet 1  . cloNIDine (CATAPRES) 0.1 MG tablet Take 1 tablet (0.1 mg total) by mouth 3 (three) times daily as needed. Take only if BP > 140/90 90 tablet 1  . lisinopril (ZESTRIL) 40 MG tablet TAKE 1 TABLET BY MOUTH DAILY. 90 tablet 1  . nitroGLYCERIN (NITROSTAT) 0.4 MG SL tablet Place 1 tablet (0.4 mg total) under the tongue every 5 (five) minutes as needed for chest pain. (Patient not taking: Reported on 03/14/2019) 100 tablet 11  . aspirin EC 81 MG tablet Take 1 tablet (81 mg total) by mouth daily. (Patient not taking: Reported on 03/14/2019) 90 tablet 2  . Chlorphen-Phenyleph-APAP (CORICIDIN D COLD/FLU/SINUS) 2-5-325 MG TABS Take 1 tablet by mouth daily as needed (cold symptoms).    . famotidine (PEPCID) 40 MG tablet Take 1 tablet (40 mg total) by mouth daily. 30 tablet 3  . hydrochlorothiazide (HYDRODIURIL) 25 MG tablet TAKE 1 TABLET BY MOUTH DAILY (Patient not taking: Reported on 03/14/2019) 90 tablet 3  . ondansetron (ZOFRAN ODT) 4 MG disintegrating tablet Take 1 tablet (4 mg total) by mouth every 8 (eight) hours as needed for nausea or vomiting. (Patient not taking: Reported on 03/14/2019) 15 tablet 0  . Sennosides (LAXATIVE) 25 MG TABS Take 1 tablet by mouth daily as needed (constipation).     No facility-administered medications prior to visit.    Allergies  Allergen Reactions  . Amlodipine Other (See Comments)    Really severe headaches     ROS Review of Systems  Constitutional: Negative.   HENT: Negative.   Eyes: Negative.   Respiratory:  Negative.   Cardiovascular: Negative.   Gastrointestinal: Negative.   Endocrine: Negative.   Genitourinary: Negative.   Musculoskeletal: Positive for arthralgias (generalized).  Skin: Negative.   Allergic/Immunologic: Negative.   Neurological: Positive for dizziness (occasional) and headaches (occasional ).  Hematological: Negative.   Psychiatric/Behavioral: Negative.       Objective:    Physical Exam  Constitutional: She is oriented to person, place, and time. She appears well-developed and well-nourished.  HENT:  Head: Normocephalic and atraumatic.  Eyes: Conjunctivae are normal.  Cardiovascular: Normal rate, regular rhythm, normal heart sounds and intact distal pulses.  Pulmonary/Chest: Effort normal  and breath sounds normal.  Abdominal: Soft. Bowel sounds are normal. She exhibits distension (obese).  Musculoskeletal:        General: Normal range of motion.     Cervical back: Normal range of motion and neck supple.  Neurological: She is alert and oriented to person, place, and time. She has normal reflexes.  Skin: Skin is warm and dry.  Psychiatric: She has a normal mood and affect. Her behavior is normal. Judgment and thought content normal.  Nursing note and vitals reviewed.   BP (!) 179/99   Pulse 69   Temp 97.7 F (36.5 C) (Oral)   Ht 5\' 1"  (1.549 m)   Wt 195 lb 6.4 oz (88.6 kg)   SpO2 98%   BMI 36.92 kg/m  Wt Readings from Last 3 Encounters:  03/14/19 195 lb 6.4 oz (88.6 kg)  09/11/18 190 lb 6.4 oz (86.4 kg)  05/22/18 184 lb (83.5 kg)     Health Maintenance Due  Topic Date Due  . PAP SMEAR-Modifier  07/02/2017    There are no preventive care reminders to display for this patient.  Lab Results  Component Value Date   TSH 1.410 04/18/2017   Lab Results  Component Value Date   WBC 7.9 02/12/2018   HGB 16.4 (H) 02/12/2018   HCT 50.4 (H) 02/12/2018   MCV 93.7 02/12/2018   PLT 322 02/12/2018   Lab Results  Component Value Date   NA 139  02/12/2018   K 3.8 02/12/2018   CO2 25 02/12/2018   GLUCOSE 89 02/12/2018   BUN 14 02/12/2018   CREATININE 1.33 (H) 02/12/2018   BILITOT 0.7 02/12/2018   ALKPHOS 75 02/12/2018   AST 15 02/12/2018   ALT 15 02/12/2018   PROT 7.7 02/12/2018   ALBUMIN 4.0 02/12/2018   CALCIUM 9.2 02/12/2018   ANIONGAP 7 02/12/2018   Lab Results  Component Value Date   CHOL 196 04/18/2017   Lab Results  Component Value Date   HDL 40 04/18/2017   Lab Results  Component Value Date   LDLCALC 132 (H) 04/18/2017   Lab Results  Component Value Date   TRIG 122 04/18/2017   Lab Results  Component Value Date   CHOLHDL 4.9 (H) 04/18/2017   Lab Results  Component Value Date   HGBA1C 5.7 (A) 03/14/2019      Assessment & Plan:   1. Accelerated hypertension Patient will take medications as prescribed, at the same time daily. She will refrain from eating candy bars and drinking coffee prior to her office visits in the future.  We will have her to return to office in 1 week for blood pressure recheck.  - lisinopril (ZESTRIL) 40 MG tablet; Take 1 tablet (40 mg total) by mouth daily.  Dispense: 30 tablet; Refill: 6  2. Essential hypertension She will continue to take medications as prescribed, to decrease high sodium intake, excessive alcohol intake, increase potassium intake, smoking cessation, and increase physical activity of at least 30 minutes of cardio activity daily. She will continue to follow Heart Healthy or DASH diet. - atenolol (TENORMIN) 50 MG tablet; Take 1 tablet (50 mg total) by mouth daily.  Dispense: 30 tablet; Refill: 6 - atorvastatin (LIPITOR) 20 MG tablet; Take 1 tablet (20 mg total) by mouth daily.  Dispense: 60 tablet; Refill: 6 - cloNIDine (CATAPRES) 0.1 MG tablet; Take 1 tablet (0.1 mg total) by mouth 3 (three) times daily as needed. Take only if BP > 140/90  Dispense: 30 tablet; Refill:  6 - hydrochlorothiazide (HYDRODIURIL) 25 MG tablet; Take 1 tablet (25 mg total) by mouth  daily.  Dispense: 30 tablet; Refill: 6  3. Anxiety - buPROPion (WELLBUTRIN SR) 150 MG 12 hr tablet; Take 1 tablet (150 mg total) by mouth 2 (two) times daily.  Dispense: 60 tablet; Refill: 6  4. Class 2 severe obesity due to excess calories with serious comorbidity and body mass index (BMI) of 35.0 to 35.9 in adult [facility]) Body mass index is 36.92 kg/m. Goal BMI  is <30. Encouraged efforts to reduce weight include engaging in physical activity as tolerated with goal of 150 minutes per week. Improve dietary choices and eat a meal regimen consistent with a Mediterranean or DASH diet. Reduce simple carbohydrates. Do not skip meals and eat healthy snacks throughout the day to avoid over-eating at dinner. Set a goal weight loss that is achievable for you.  5. Health care maintenance - Urinalysis Dipstick - POCT HgB A1C - Glucose (CBG)  6. Follow up She will follow up in 1 month.   Meds ordered this encounter  Medications  . atenolol (TENORMIN) 50 MG tablet    Sig: Take 1 tablet (50 mg total) by mouth daily.    Dispense:  30 tablet    Refill:  6  . atorvastatin (LIPITOR) 20 MG tablet    Sig: Take 1 tablet (20 mg total) by mouth daily.    Dispense:  60 tablet    Refill:  6  . buPROPion (WELLBUTRIN SR) 150 MG 12 hr tablet    Sig: Take 1 tablet (150 mg total) by mouth 2 (two) times daily.    Dispense:  60 tablet    Refill:  6  . cloNIDine (CATAPRES) 0.1 MG tablet    Sig: Take 1 tablet (0.1 mg total) by mouth 3 (three) times daily as needed. Take only if BP > 140/90    Dispense:  30 tablet    Refill:  6  . lisinopril (ZESTRIL) 40 MG tablet    Sig: Take 1 tablet (40 mg total) by mouth daily.    Dispense:  30 tablet    Refill:  6  . hydrochlorothiazide (HYDRODIURIL) 25 MG tablet    Sig: Take 1 tablet (25 mg total) by mouth daily.    Dispense:  30 tablet    Refill:  6    Orders Placed This Encounter  Procedures  . Urinalysis Dipstick  . POCT HgB A1C  . Glucose (CBG)    Referral  Orders  No referral(s) requested today    Kathe Becton,  MSN, FNP-BC [place], [place] [address] [phone] fax   Problem List Items Addressed This Visit      Cardiovascular and Mediastinum   Accelerated hypertension - Primary   Relevant Medications   atenolol (TENORMIN) 50 MG tablet   atorvastatin (LIPITOR) 20 MG tablet   cloNIDine (CATAPRES) 0.1 MG tablet   lisinopril (ZESTRIL) 40 MG tablet   hydrochlorothiazide (HYDRODIURIL) 25 MG tablet   HTN (hypertension) (Chronic)   Relevant Medications   atenolol (TENORMIN) 50 MG tablet   atorvastatin (LIPITOR) 20 MG tablet   cloNIDine (CATAPRES) 0.1 MG tablet   lisinopril (ZESTRIL) 40 MG tablet   hydrochlorothiazide (HYDRODIURIL) 25 MG tablet     Other   Anxiety   Relevant Medications   buPROPion (WELLBUTRIN SR) 150 MG 12 hr tablet    Other Visit Diagnoses  Class 2 severe obesity due to excess calories with serious comorbidity and body mass index (BMI) of 35.0 to 35.9 in adult [facility])       Health care maintenance       Relevant Orders   Urinalysis Dipstick (Completed)   POCT HgB A1C (Completed)   Glucose (CBG) (Completed)   Follow up          Meds ordered this encounter  Medications  . atenolol (TENORMIN) 50 MG tablet    Sig: Take 1 tablet (50 mg total) by mouth daily.    Dispense:  30 tablet    Refill:  6  . atorvastatin (LIPITOR) 20 MG tablet    Sig: Take 1 tablet (20 mg total) by mouth daily.    Dispense:  60 tablet    Refill:  6  . buPROPion (WELLBUTRIN SR) 150 MG 12 hr tablet    Sig: Take 1 tablet (150 mg total) by mouth 2 (two) times daily.    Dispense:  60 tablet    Refill:  6  . cloNIDine (CATAPRES) 0.1 MG tablet    Sig: Take 1 tablet (0.1 mg total) by mouth 3 (three) times daily as needed. Take only if BP > 140/90    Dispense:  30 tablet    Refill:  6  . lisinopril (ZESTRIL) 40 MG tablet     Sig: Take 1 tablet (40 mg total) by mouth daily.    Dispense:  30 tablet    Refill:  6  . hydrochlorothiazide (HYDRODIURIL) 25 MG tablet    Sig: Take 1 tablet (25 mg total) by mouth daily.    Dispense:  30 tablet    Refill:  6    Follow-up: Return in about 1 month (around 04/14/2019).    Kallie Locks, FNP

## 2019-03-15 DIAGNOSIS — E66812 Obesity, class 2: Secondary | ICD-10-CM | POA: Insufficient documentation

## 2019-03-15 DIAGNOSIS — Z6835 Body mass index (BMI) 35.0-35.9, adult: Secondary | ICD-10-CM | POA: Insufficient documentation

## 2019-03-21 ENCOUNTER — Ambulatory Visit: Payer: No Typology Code available for payment source

## 2019-03-21 ENCOUNTER — Other Ambulatory Visit: Payer: Self-pay

## 2019-03-21 VITALS — BP 168/92

## 2019-03-21 DIAGNOSIS — I10 Essential (primary) hypertension: Secondary | ICD-10-CM

## 2019-03-21 NOTE — Progress Notes (Signed)
Nurse visit BP check. Blood pressure medication taken at 10:30 PM last night.  Today's BP is 170/92, 168/92.   Patient will return for a BP check.

## 2019-03-28 ENCOUNTER — Ambulatory Visit: Payer: No Typology Code available for payment source | Admitting: Family Medicine

## 2019-04-02 MED FILL — LISINOPRIL 40 MG TABLET: 40 | 30 days supply | Qty: 30 | Fill #5

## 2019-04-02 MED FILL — ?ATENOLOL 50MG TABLET: 50 | 30 days supply | Qty: 30 | Fill #0

## 2019-04-13 ENCOUNTER — Ambulatory Visit: Payer: No Typology Code available for payment source | Admitting: Family Medicine

## 2019-05-04 MED FILL — ATENOLOL 50 MG TABLET: 50 | 30 days supply | Qty: 30 | Fill #1

## 2019-05-04 MED FILL — LISINOPRIL 40 MG TABLET: 40 | 30 days supply | Qty: 30 | Fill #0

## 2019-06-05 MED FILL — ATENOLOL 50 MG TABLET: 50 | 30 days supply | Qty: 30 | Fill #2

## 2019-06-05 MED FILL — LISINOPRIL 40 MG TABLET: 40 | 30 days supply | Qty: 30 | Fill #1

## 2019-07-06 MED FILL — LISINOPRIL 40 MG TABLET: 40 | 30 days supply | Qty: 30 | Fill #2

## 2019-07-06 MED FILL — ATENOLOL 50 MG TABLET: 50 | 30 days supply | Qty: 30 | Fill #3

## 2019-07-06 MED FILL — ?CETIRIZINE HCL 10 MG TABLE: 10 | 30 days supply | Qty: 30 | Fill #2

## 2019-08-10 MED FILL — ATENOLOL 50 MG TABLET: 50 | 30 days supply | Qty: 30 | Fill #4

## 2019-08-10 MED FILL — LISINOPRIL 40 MG TABLET: 40 | 30 days supply | Qty: 30 | Fill #3

## 2019-08-17 ENCOUNTER — Other Ambulatory Visit: Payer: Self-pay

## 2019-08-17 ENCOUNTER — Ambulatory Visit: Payer: No Typology Code available for payment source | Admitting: Family Medicine

## 2019-09-13 MED FILL — HYDROCHLOROTHIAZIDE 25 MG T: 25 | 30 days supply | Qty: 30 | Fill #0

## 2019-09-13 MED FILL — LISINOPRIL 40 MG TABLET: 40 | 30 days supply | Qty: 30 | Fill #4

## 2019-09-14 MED FILL — ATENOLOL 50 MG TABLET: 50 | 30 days supply | Qty: 30 | Fill #5

## 2019-09-19 ENCOUNTER — Ambulatory Visit: Payer: No Typology Code available for payment source

## 2019-09-19 ENCOUNTER — Telehealth: Payer: Self-pay | Admitting: Family Medicine

## 2019-09-19 NOTE — Telephone Encounter (Signed)
Rescheduled due to patients family emergency.   Copied from CRM [id number]. Topic: Appointment Scheduling - Scheduling Inquiry for Clinic >> Sep 18, 2019  4:19 PM Floria Raveling A wrote: Reason for CRM: pt called in stated she needs to resch her appt the the fin counselor , office was on the other line could not warm transfer .  Please advise  Best number [phone]

## 2019-09-27 ENCOUNTER — Ambulatory Visit: Payer: Self-pay

## 2019-09-27 ENCOUNTER — Other Ambulatory Visit: Payer: Self-pay

## 2019-10-12 ENCOUNTER — Ambulatory Visit: Payer: No Typology Code available for payment source | Admitting: Family Medicine

## 2019-10-26 MED FILL — LISINOPRIL 40 MG TABLET: 40 | 30 days supply | Qty: 30 | Fill #5

## 2019-10-26 MED FILL — HYDROCHLOROTHIAZIDE 25 MG T: 25 | 30 days supply | Qty: 30 | Fill #1

## 2019-10-26 MED FILL — ATENOLOL 50 MG TABLET: 50 | 30 days supply | Qty: 30 | Fill #6

## 2019-10-29 MED FILL — ?ATORVASTATIN 20 MG TABLET: 20 | 30 days supply | Qty: 30 | Fill #0

## 2019-11-06 MED FILL — ?ATORVASTATIN 20 MG TABLET: 20 | 30 days supply | Qty: 30 | Fill #0

## 2019-12-07 ENCOUNTER — Other Ambulatory Visit: Payer: Self-pay | Admitting: Family Medicine

## 2019-12-07 DIAGNOSIS — I10 Essential (primary) hypertension: Secondary | ICD-10-CM

## 2019-12-07 MED FILL — ATENOLOL 50 MG TABLET: 50 | 30 days supply | Qty: 30 | Fill #0

## 2019-12-07 MED FILL — HYDROCHLOROTHIAZIDE 25 MG T: 25 | 30 days supply | Qty: 30 | Fill #2

## 2019-12-07 MED FILL — LISINOPRIL 40 MG TABLET: 40 | 30 days supply | Qty: 30 | Fill #6

## 2020-01-17 ENCOUNTER — Other Ambulatory Visit: Payer: Self-pay | Admitting: Family Medicine

## 2020-01-17 DIAGNOSIS — I10 Essential (primary) hypertension: Secondary | ICD-10-CM

## 2020-01-17 MED FILL — HYDROCHLOROTHIAZIDE 25 MG T: 25 | 30 days supply | Qty: 30 | Fill #3

## 2020-01-21 ENCOUNTER — Telehealth: Payer: Self-pay | Admitting: Family Medicine

## 2020-01-22 NOTE — Telephone Encounter (Signed)
Sent to provider 

## 2020-01-24 ENCOUNTER — Other Ambulatory Visit: Payer: Self-pay | Admitting: Family Medicine

## 2020-01-24 DIAGNOSIS — I10 Essential (primary) hypertension: Secondary | ICD-10-CM

## 2020-01-24 MED ORDER — HYDROCHLOROTHIAZIDE 25 MG PO TABS: 25.0000 mg | ORAL_TABLET | Freq: Every day | ORAL | 0 refills | Status: DC

## 2020-01-24 MED ORDER — LISINOPRIL 40 MG PO TABS: 40.0000 mg | ORAL_TABLET | Freq: Every day | ORAL | 0 refills | Status: DC

## 2020-01-24 MED ORDER — ATENOLOL 50 MG PO TABS: 50.0000 mg | ORAL_TABLET | Freq: Every day | ORAL | 0 refills | Status: DC

## 2020-01-24 MED FILL — ATENOLOL 50 MG TABLET: 50 | 30 days supply | Qty: 30 | Fill #0

## 2020-01-24 MED FILL — LISINOPRIL 40 MG TABLET: 40 | 30 days supply | Qty: 30 | Fill #0

## 2020-01-24 MED FILL — HYDROCHLOROTHIAZIDE 25 MG T: 25 | 30 days supply | Qty: 30 | Fill #0

## 2020-02-05 ENCOUNTER — Ambulatory Visit (INDEPENDENT_AMBULATORY_CARE_PROVIDER_SITE_OTHER): Payer: Self-pay | Admitting: Family Medicine

## 2020-02-05 ENCOUNTER — Other Ambulatory Visit: Payer: Self-pay

## 2020-02-05 ENCOUNTER — Encounter: Payer: Self-pay | Admitting: Family Medicine

## 2020-02-05 VITALS — BP 174/96 | Temp 97.0°F | Ht 61.0 in | Wt 189.6 lb

## 2020-02-05 DIAGNOSIS — F419 Anxiety disorder, unspecified: Secondary | ICD-10-CM

## 2020-02-05 DIAGNOSIS — Z760 Encounter for issue of repeat prescription: Secondary | ICD-10-CM

## 2020-02-05 DIAGNOSIS — S46911A Strain of unspecified muscle, fascia and tendon at shoulder and upper arm level, right arm, initial encounter: Secondary | ICD-10-CM

## 2020-02-05 DIAGNOSIS — M25511 Pain in right shoulder: Secondary | ICD-10-CM

## 2020-02-05 DIAGNOSIS — Z09 Encounter for follow-up examination after completed treatment for conditions other than malignant neoplasm: Secondary | ICD-10-CM

## 2020-02-05 DIAGNOSIS — I10 Essential (primary) hypertension: Secondary | ICD-10-CM

## 2020-02-05 DIAGNOSIS — X503XXA Overexertion from repetitive movements, initial encounter: Secondary | ICD-10-CM

## 2020-02-05 DIAGNOSIS — Z1231 Encounter for screening mammogram for malignant neoplasm of breast: Secondary | ICD-10-CM

## 2020-02-05 MED ORDER — NAPROXEN 500 MG PO TABS: 500.0000 mg | ORAL_TABLET | Freq: Two times a day (BID) | ORAL | 3 refills | Status: DC

## 2020-02-05 MED FILL — ?NAPROXEN 500 MG TABS: 500 | 30 days supply | Qty: 60 | Fill #0

## 2020-02-05 NOTE — Patient Instructions (Signed)
Naproxen Sodium oral tablet, extended-release What is this medicine? NAPROXEN (na PROX en) is a non-steroidal anti-inflammatory drug (NSAID). It is used to reduce swelling and to treat pain. This medicine may be used for dental pain, headache, or painful monthly periods. It is also used for painful joint and muscular problems such as arthritis, tendinitis, bursitis, and gout. This medicine may be used for other purposes; ask your health care provider or pharmacist if you have questions. COMMON BRAND NAME(S): Midol Extended Relief, Naprelan, Naprelan Dose Card What should I tell my health care provider before I take this medicine? They need to know if you have any of these conditions:  cigarette smoker  coronary artery bypass graft (CABG) surgery within the past 2 weeks  drink more than 3 alcohol-containing drinks a day  heart disease  high blood pressure  history of stomach bleeding  kidney disease  liver disease  lung or breathing disease, like asthma  an unusual or allergic reaction to naproxen, aspirin, other NSAIDs, other medicines, foods, dyes, or preservatives  pregnant or trying to get pregnant  breast-feeding How should I use this medicine? Take this medicine by mouth with a glass of water. Follow the directions on the prescription label. Take this medicine with food if it upsets your stomach. Try to not lie down for at least 10 minutes after you take it. Take your medicine at regular intervals. Do not take your medicine more often than directed. Long-term, continuous use may increase the risk of heart attack or stroke. A special MedGuide will be given to you by the pharmacist with each prescription and refill. Be sure to read this information carefully each time. Talk to your pediatrician regarding the use of this medicine in children. Special care may be needed. Overdosage: If you think you have taken too much of this medicine contact a poison control center or emergency  room at once. NOTE: This medicine is only for you. Do not share this medicine with others. What if I miss a dose? If you miss a dose, take it as soon as you can. If it is almost time for your next dose, take only that dose. Do not take double or extra doses. What may interact with this medicine?  alcohol  aspirin  cidofovir  diuretics  lithium  methotrexate  other drugs for inflammation like ketorolac or prednisone  pemetrexed  probenecid  warfarin This list may not describe all possible interactions. Give your health care provider a list of all the medicines, herbs, non-prescription drugs, or dietary supplements you use. Also tell them if you smoke, drink alcohol, or use illegal drugs. Some items may interact with your medicine. What should I watch for while using this medicine? Tell your doctor or healthcare provider if your pain does not get better. Talk to your doctor before taking another medicine for pain. Do not treat yourself. This medicine does not prevent heart attack or stroke. In fact, this medicine may increase the chance of a heart attack or stroke. The chance may increase with longer use of this medicine and in people who have heart disease. If you take aspirin to prevent heart attack or stroke, talk with your doctor or healthcare provider. This medicine may cause serious skin reactions. They can happen weeks to months after starting the medicine. Contact your healthcare provider right away if you notice fevers or flu-like symptoms with a rash. The rash may be red or purple and then turn into blisters or peeling of the skin.  Or, you might notice a red rash with swelling of the face, lips or lymph nodes in your neck or under your arms. Do not take other medicines that contain aspirin, ibuprofen, or naproxen with this medicine. Side effects such as stomach upset, nausea, or ulcers may be more likely to occur. Many medicines available without a prescription should not be  taken with this medicine. This medicine can cause ulcers and bleeding in the stomach and intestines at any time during treatment. Do not smoke cigarettes or drink alcohol. These increase irritation to your stomach and can make it more susceptible to damage from this medicine. Ulcers and bleeding can happen without warning symptoms and can cause death. You may get drowsy or dizzy. Do not drive, use machinery, or do anything that needs mental alertness until you know how this medicine affects you. Do not stand or sit up quickly, especially if you are an older patient. This reduces the risk of dizzy or fainting spells. This medicine can cause you to bleed more easily. Try to avoid damage to your teeth and gums when you brush or floss your teeth. What side effects may I notice from receiving this medicine? Side effects that you should report to your doctor or health care professional as soon as possible:  black or bloody stools, blood in the urine or vomit  blurred vision  chest pain  difficulty breathing or wheezing  nausea or vomiting  redness, blistering, peeling, or loosening of the skin, including inside the mouth  severe stomach pain  skin rash, hives, or itching  slurred speech or weakness on one side of the body  swelling of eyelids, throat, lips  unexplained weight gain or swelling  unusually weak or tired  yellowing of eyes or skin Side effects that usually do not require medical attention (report to your doctor or health care professional if they continue or are bothersome):  constipation  headache  heartburn This list may not describe all possible side effects. Call your doctor for medical advice about side effects. You may report side effects to FDA at 1-800-FDA-1088. Where should I keep my medicine? Keep out of the reach of children. Store at room temperature between 20 and 25 degrees C (68 and 77 degrees F). Keep container tightly closed. Throw away any unused  medicine after the expiration date. NOTE: This sheet is a summary. It may not cover all possible information. If you have questions about this medicine, talk to your doctor, pharmacist, or health care provider.  2020 Elsevier/Gold Standard (2018-05-16 07:54:00) Overtraining in Athletes Overtraining is when an athlete trains too hard or for too long. Overtraining can cause a variety of problems. It can hurt athletic performance, lead to injury, and cause physical and mental symptoms. Overtraining is also called burnout. What are the causes? This condition is caused by working too hard and too long at a sport or activity. It happens when the body does not have enough time to recover. This condition can happen if:  You repeat the same motions every day, with no variety in activity.  You practice the same sport for more than 5 days a week with no break during those 5 days.  You practice the same sport for more than 10 months a year with no break during those 10 months. What increases the risk? This condition is more likely to develop in:  Adolescents whose bodies are still developing and growing.  Athletes who specialize in one sport.  Athletes who do not  take adequate breaks between days of practice or between seasons of competition. What are the signs or symptoms? Physical symptoms of this condition include:  Joint and muscle pain.  Fast heart rate.  Loss of appetite.  Getting sick more often than before.  Tiredness (fatigue). Mental symptoms of this condition include:  Trouble concentrating.  Lack of usual interest in a sport.  Difficulty completing usual routines and practices.  Changes in personality, such as being irritable or moody. How is this diagnosed? This condition is diagnosed based on:  Your symptoms and history of physical activity.  A physical exam. Tests may be done to rule out other causes of your symptoms. How is this treated? This condition is  treated by making changes to your usual routine. Changes may include:  Resting regularly.  Taking a break from your training.  Adding more variety into your workouts. This will also help prevent repetitive use injuries.  Reducing your training and competition schedule. Follow these instructions at home:  Rest as told by your health care provider.  Make changes to your usual routine as recommended by your health care provider.  Make sure you are eating a well-balanced diet and staying hydrated. How is this prevented?  Focus on variety and fun in athletic training, rather than on repetition and intensity.  Take breaks between practices and seasons as needed.  Do not increase repetitions, distance, or other training goals by more than 10 percent each week.  Do not push yourself too hard. Contact a health care provider if:  You have lasting joint and muscle pain.  Your resting heart rate is faster than normal.  You are getting sick more often than before.  You lose your appetite.  You feel sad.  You have trouble concentrating or doing daily tasks. Summary  Overtraining is when an athlete trains too hard or for too long.  Overtraining can hurt athletic performance, lead to injury, and cause physical and mental symptoms.  This condition is treated by making changes to your usual routine.  Rest as told by your health care provider. This information is not intended to replace advice given to you by your health care provider. Make sure you discuss any questions you have with your health care provider. Document Revised: 01/18/2018 Document Reviewed: 01/19/2018 Elsevier Patient Education  2020 [employer].

## 2020-02-05 NOTE — Progress Notes (Signed)
[facility]  Annual Physical   Subjective:  Patient ID: Jennifer Ayers, female    DOB: Nov 11, 1975  Age: 44 y.o. MRN: [id number]  CC:  Chief Complaint  Patient presents with  . Follow-up    HPI Jennifer Ayers is a 44 year old female who presents for Annual Physical today.  Patient Active Problem List   Diagnosis Date Noted  . Class 2 severe obesity due to excess calories with serious comorbidity and body mass index (BMI) of 35.0 to 35.9 in adult (HCC) 03/15/2019  . Dental caries 05/22/2018  . Anxiety 05/22/2018  . Weight decrease 05/22/2018  . Precordial pain   . Cigar smoker 04/26/2017  . Cigarette smoker 04/26/2017  . Dyspnea on exertion 04/18/2017  . Accelerated hypertension 11/19/2016  . HTN (hypertension) 07/11/2016  . Right carotid bruit 07/11/2016   Current Status: Since her last office visit, she has c/o right shoulder pain X a few months now, which she contributes to her job activity. She is taking Acetaminophen occasionally for pain. Her blood pressures are elevated today. She admits to drinking a large cup of coffee prior to her appointment today. She denies visual changes, chest pain, cough, shortness of breath, heart palpitations, and falls. She has occasional headaches and dizziness with position changes. Denies severe headaches, confusion, seizures, double vision, and blurred vision, nausea and vomiting. She denies fevers, chills,  fatigue, recent infections, weight loss, and night sweats. Denies GI problems such as diarrhea, and constipation. She has no reports of blood in stools, dysuria and hematuria. No depression or anxiety reported today. She is taking all medications as prescribed.   Past Medical History:  Diagnosis Date  . Hypertension     Past Surgical History:  Procedure Laterality Date  . CESAREAN SECTION    . LEFT HEART CATH AND CORONARY ANGIOGRAPHY N/A 04/28/2017   Procedure: LEFT HEART CATH AND  CORONARY ANGIOGRAPHY;  Surgeon: Corky Crafts, MD;  Location: [facility] INVASIVE CV LAB;  Service: Cardiovascular;  Laterality: N/A;  . TONSILLECTOMY      Family History  Problem Relation Age of Onset  . Diabetes Mother   . Hypertension Mother   . Diabetes Father   . Hypertension Father    year old female wh  Social History   Socioeconomic History  . Marital status: Single    Spouse name: Not on file  . Number of children: Not on file  . Years of education: Not on file  . Highest education level: Not on file  Occupational History  . Not on file  Tobacco Use  . Smoking status: Current Every Day Smoker    Years: 5.00    Types: Cigarettes  . Smokeless tobacco: Never Used  Vaping Use  . Vaping Use: Never used  Substance and Sexual Activity  . Alcohol use: No  . Drug use: No  . Sexual activity: Yes    Birth control/protection: None  Other Topics Concern  . Not on file  Social History Narrative  . Not on file   Social Determinants of Health   Financial Resource Strain:   . Difficulty of Paying Living Expenses: Not on file  Food Insecurity:   . Worried About Programme researcher, broadcasting/film/video in the Last Year: Not on file  . Ran Out of Food in the Last Year: Not on file  Transportation Needs:   . Lack of Transportation (Medical): Not on file  . Lack of Transportation (Non-Medical): Not on  file  Physical Activity:   . Days of Exercise per Week: Not on file  . Minutes of Exercise per Session: Not on file  Stress:   . Feeling of Stress : Not on file  Social Connections:   . Frequency of Communication with Friends and Family: Not on file  . Frequency of Social Gatherings with Friends and Family: Not on file  . Attends Religious Services: Not on file  . Active Member of Clubs or Organizations: Not on file  . Attends BankerClub or Organization Meetings: Not on file  . Marital Status: Not on file  Intimate Partner Violence:   . Fear of Current or Ex-Partner: Not on file  . Emotionally  Abused: Not on file  . Physically Abused: Not on file  . Sexually Abused: Not on file    Outpatient Medications Prior to Visit  Medication Sig Dispense Refill  . acetaminophen (TYLENOL) 500 MG tablet Take 1 tablet (500 mg total) by mouth every 6 (six) hours as needed. (Patient taking differently: Take 500 mg by mouth every 6 (six) hours as needed for moderate pain or headache. ) 30 tablet 0  . etonogestrel (NEXPLANON) 68 MG IMPL implant 1 each by Subdermal route once.    . lactulose (CHRONULAC) 10 GM/15ML solution Take 15 mLs (10 g total) by mouth 3 (three) times daily. 437 mL 11  . atenolol (TENORMIN) 50 MG tablet Take 1 tablet (50 mg total) by mouth daily. 30 tablet 0  . atorvastatin (LIPITOR) 20 MG tablet Take 1 tablet (20 mg total) by mouth daily. 60 tablet 6  . cetirizine (ZYRTEC) 10 MG tablet TAKE 1 TABLET BY MOUTH DAILY. 30 tablet 11  . cloNIDine (CATAPRES) 0.1 MG tablet Take 1 tablet (0.1 mg total) by mouth 3 (three) times daily as needed. Take only if BP > 140/90 30 tablet 6  . hydrochlorothiazide (HYDRODIURIL) 25 MG tablet Take 1 tablet (25 mg total) by mouth daily. 30 tablet 0  . lisinopril (ZESTRIL) 40 MG tablet Take 1 tablet (40 mg total) by mouth daily. 30 tablet 0  . nitroGLYCERIN (NITROSTAT) 0.4 MG SL tablet Place 1 tablet (0.4 mg total) under the tongue every 5 (five) minutes as needed for chest pain. (Patient not taking: Reported on 02/05/2020) 100 tablet 11  . buPROPion (WELLBUTRIN SR) 150 MG 12 hr tablet Take 1 tablet (150 mg total) by mouth 2 (two) times daily. (Patient not taking: Reported on 02/05/2020) 60 tablet 6   No facility-administered medications prior to visit.    Allergies  Allergen Reactions  . Amlodipine Other (See Comments)    Really severe headaches     ROS Review of Systems  Constitutional: Negative.   HENT: Negative.   Eyes: Negative.   Respiratory: Negative.   Cardiovascular: Negative.   Gastrointestinal: Positive for abdominal distention  (obese).  Endocrine: Negative.   Genitourinary: Negative.   Musculoskeletal: Positive for arthralgias (generalized joint pan).       Right shoulder pain  Skin: Negative.   Allergic/Immunologic: Negative.   Neurological: Positive for dizziness (occasional ) and headaches (occasional ).  Hematological: Negative.   Psychiatric/Behavioral: Negative.       Objective:    Physical Exam Vitals and nursing note reviewed.  Constitutional:      Appearance: Normal appearance.  HENT:     Head: Normocephalic and atraumatic.     Nose: Nose normal.     Mouth/Throat:     Mouth: Mucous membranes are moist.     Pharynx: Oropharynx  is clear.  Cardiovascular:     Rate and Rhythm: Normal rate and regular rhythm.     Pulses: Normal pulses.     Heart sounds: Normal heart sounds.  Pulmonary:     Effort: Pulmonary effort is normal.     Breath sounds: Normal breath sounds.  Abdominal:     General: Bowel sounds are normal.     Palpations: Abdomen is soft.  Musculoskeletal:     Cervical back: Normal range of motion and neck supple.  Neurological:     Mental Status: She is alert.    BP (!) 174/96 (BP Location: Left Arm, Patient Position: Sitting, Cuff Size: Large)   Temp (!) 97 F (36.1 C)   Ht 5\' 1"  (1.549 m)   Wt 189 lb 9.6 oz (86 kg)   BMI 35.82 kg/m  Wt Readings from Last 3 Encounters:  02/05/20 189 lb 9.6 oz (86 kg)  03/14/19 195 lb 6.4 oz (88.6 kg)  09/11/18 190 lb 6.4 oz (86.4 kg)     Health Maintenance Due  Topic Date Due  . Hepatitis C Screening  Never done  . COVID-19 Vaccine (1) Never done  . PAP SMEAR-Modifier  07/02/2017    There are no preventive care reminders to display for this patient.  Lab Results  Component Value Date   TSH 0.837 02/05/2020   Lab Results  Component Value Date   WBC 7.7 02/05/2020   HGB 15.2 02/05/2020   HCT 45.1 02/05/2020   MCV 90 02/05/2020   PLT 321 02/05/2020   Lab Results  Component Value Date   NA 139 02/12/2018   K 3.8  02/12/2018   CO2 25 02/12/2018   GLUCOSE 89 02/12/2018   BUN 14 02/12/2018   CREATININE 1.33 (H) 02/12/2018   BILITOT 0.7 02/12/2018   ALKPHOS 75 02/12/2018   AST 15 02/12/2018   ALT 15 02/12/2018   PROT 7.7 02/12/2018   ALBUMIN 4.0 02/12/2018   CALCIUM 9.2 02/12/2018   ANIONGAP 7 02/12/2018   Lab Results  Component Value Date   CHOL 196 04/18/2017   Lab Results  Component Value Date   HDL 40 04/18/2017   Lab Results  Component Value Date   LDLCALC 132 (H) 04/18/2017   Lab Results  Component Value Date   TRIG 122 04/18/2017   Lab Results  Component Value Date   CHOLHDL 4.9 (H) 04/18/2017   Lab Results  Component Value Date   HGBA1C 5.7 (A) 03/14/2019    Assessment & Plan:   1. Accelerated hypertension She will remember not to smoke or drink coffee prior to her office appointment. She will continue to take medications as prescribed, to decrease high sodium intake, excessive alcohol intake, increase potassium intake, smoking cessation, and increase physical activity of at least 30 minutes of cardio activity daily. She will continue to follow Heart Healthy or DASH diet. - lisinopril (ZESTRIL) 40 MG tablet; Take 1 tablet (40 mg total) by mouth daily.  Dispense: 90 tablet; Refill: 3  2. Essential hypertension - CBC with Differential - Comprehensive metabolic panel; Future - Urinalysis - TSH - Vitamin B12 - Vitamin D, 25-hydroxy - atenolol (TENORMIN) 50 MG tablet; Take 1 tablet (50 mg total) by mouth daily.  Dispense: 90 tablet; Refill: 3 - atorvastatin (LIPITOR) 20 MG tablet; Take 1 tablet (20 mg total) by mouth daily.  Dispense: 90 tablet; Refill: 3 - cloNIDine (CATAPRES) 0.1 MG tablet; Take 1 tablet (0.1 mg total) by mouth 3 (three) times daily as  needed. Take only if BP > 140/90  Dispense: 30 tablet; Refill: 6 - hydrochlorothiazide (HYDRODIURIL) 25 MG tablet; Take 1 tablet (25 mg total) by mouth daily.  Dispense: 90 tablet; Refill: 3  3. Breast cancer screening  by mammogram - MM 3D SCREEN BREAST BILATERAL; Future  4. Overuse syndrome of shoulder, right, initial encounter - naproxen (NAPROSYN) 500 MG tablet; Take 1 tablet (500 mg total) by mouth 2 (two) times daily with a meal.  Dispense: 180 tablet; Refill: 3  5. Acute pain of right shoulder - naproxen (NAPROSYN) 500 MG tablet; Take 1 tablet (500 mg total) by mouth 2 (two) times daily with a meal.  Dispense: 180 tablet; Refill: 3  6. Anxiety - buPROPion (WELLBUTRIN SR) 150 MG 12 hr tablet; Take 1 tablet (150 mg total) by mouth 2 (two) times daily.  Dispense: 180 tablet; Refill: 3  7. Medication refill  8. Follow up She will follow up in 1 week for Nurse Visit only. She will follow up in 2 months for Office Visit.   Meds ordered this encounter  Medications  . DISCONTD: naproxen (NAPROSYN) 500 MG tablet    Sig: Take 1 tablet (500 mg total) by mouth 2 (two) times daily with a meal.    Dispense:  60 tablet    Refill:  3  . atenolol (TENORMIN) 50 MG tablet    Sig: Take 1 tablet (50 mg total) by mouth daily.    Dispense:  90 tablet    Refill:  3  . atorvastatin (LIPITOR) 20 MG tablet    Sig: Take 1 tablet (20 mg total) by mouth daily.    Dispense:  90 tablet    Refill:  3  . cetirizine (ZYRTEC) 10 MG tablet    Sig: Take 1 tablet (10 mg total) by mouth daily.    Dispense:  90 tablet    Refill:  3  . cloNIDine (CATAPRES) 0.1 MG tablet    Sig: Take 1 tablet (0.1 mg total) by mouth 3 (three) times daily as needed. Take only if BP > 140/90    Dispense:  30 tablet    Refill:  6  . hydrochlorothiazide (HYDRODIURIL) 25 MG tablet    Sig: Take 1 tablet (25 mg total) by mouth daily.    Dispense:  90 tablet    Refill:  3  . lisinopril (ZESTRIL) 40 MG tablet    Sig: Take 1 tablet (40 mg total) by mouth daily.    Dispense:  90 tablet    Refill:  3  . buPROPion (WELLBUTRIN SR) 150 MG 12 hr tablet    Sig: Take 1 tablet (150 mg total) by mouth 2 (two) times daily.    Dispense:  180 tablet     Refill:  3  . naproxen (NAPROSYN) 500 MG tablet    Sig: Take 1 tablet (500 mg total) by mouth 2 (two) times daily with a meal.    Dispense:  180 tablet    Refill:  3    Orders Placed This Encounter  Procedures  . MM 3D SCREEN BREAST BILATERAL  . CBC with Differential  . Comprehensive metabolic panel  . Urinalysis  . TSH  . Vitamin B12  . Vitamin D, 25-hydroxy    Referral Orders  No referral(s) requested today    Raliegh Ip, MSN, ANE, FNP-BC [facility] [address] [place], [place] [address] [phone]  Problem List Items  Addressed This Visit      Cardiovascular and Mediastinum   Accelerated hypertension - Primary   Relevant Medications   atenolol (TENORMIN) 50 MG tablet   atorvastatin (LIPITOR) 20 MG tablet   cloNIDine (CATAPRES) 0.1 MG tablet   hydrochlorothiazide (HYDRODIURIL) 25 MG tablet   lisinopril (ZESTRIL) 40 MG tablet     Other   Anxiety   Relevant Medications   buPROPion (WELLBUTRIN SR) 150 MG 12 hr tablet    Other Visit Diagnoses    Essential hypertension       Relevant Medications   atenolol (TENORMIN) 50 MG tablet   atorvastatin (LIPITOR) 20 MG tablet   cloNIDine (CATAPRES) 0.1 MG tablet   hydrochlorothiazide (HYDRODIURIL) 25 MG tablet   lisinopril (ZESTRIL) 40 MG tablet   Other Relevant Orders   CBC with Differential (Completed)   Comprehensive metabolic panel   Urinalysis (Completed)   TSH (Completed)   Vitamin B12 (Completed)   Vitamin D, 25-hydroxy (Completed)   Breast cancer screening by mammogram       Relevant Orders   MM 3D SCREEN BREAST BILATERAL   Overuse syndrome of shoulder, right, initial encounter       Relevant Medications   naproxen (NAPROSYN) 500 MG tablet   Acute pain of right shoulder       Relevant Medications   naproxen (NAPROSYN) 500 MG tablet   Medication refill       Follow up          Meds  ordered this encounter  Medications  . DISCONTD: naproxen (NAPROSYN) 500 MG tablet    Sig: Take 1 tablet (500 mg total) by mouth 2 (two) times daily with a meal.    Dispense:  60 tablet    Refill:  3  . atenolol (TENORMIN) 50 MG tablet    Sig: Take 1 tablet (50 mg total) by mouth daily.    Dispense:  90 tablet    Refill:  3  . atorvastatin (LIPITOR) 20 MG tablet    Sig: Take 1 tablet (20 mg total) by mouth daily.    Dispense:  90 tablet    Refill:  3  . cetirizine (ZYRTEC) 10 MG tablet    Sig: Take 1 tablet (10 mg total) by mouth daily.    Dispense:  90 tablet    Refill:  3  . cloNIDine (CATAPRES) 0.1 MG tablet    Sig: Take 1 tablet (0.1 mg total) by mouth 3 (three) times daily as needed. Take only if BP > 140/90    Dispense:  30 tablet    Refill:  6  . hydrochlorothiazide (HYDRODIURIL) 25 MG tablet    Sig: Take 1 tablet (25 mg total) by mouth daily.    Dispense:  90 tablet    Refill:  3  . lisinopril (ZESTRIL) 40 MG tablet    Sig: Take 1 tablet (40 mg total) by mouth daily.    Dispense:  90 tablet    Refill:  3  . buPROPion (WELLBUTRIN SR) 150 MG 12 hr tablet    Sig: Take 1 tablet (150 mg total) by mouth 2 (two) times daily.    Dispense:  180 tablet    Refill:  3  . naproxen (NAPROSYN) 500 MG tablet    Sig: Take 1 tablet (500 mg total) by mouth 2 (two) times daily with a meal.    Dispense:  180 tablet    Refill:  3    Follow-up: No follow-ups on file.  Azzie Glatter, FNP

## 2020-02-06 LAB — CBC WITH DIFFERENTIAL/PLATELET
Basophils Absolute: 0.1 10*3/uL (ref 0.0–0.2)
Basos: 1 %
EOS (ABSOLUTE): 0.4 10*3/uL (ref 0.0–0.4)
Eos: 5 %
Hematocrit: 45.1 % (ref 34.0–46.6)
Hemoglobin: 15.2 g/dL (ref 11.1–15.9)
Immature Grans (Abs): 0 10*3/uL (ref 0.0–0.1)
Immature Granulocytes: 0 %
Lymphocytes Absolute: 3.2 10*3/uL — ABNORMAL HIGH (ref 0.7–3.1)
Lymphs: 41 %
MCH: 30.2 pg (ref 26.6–33.0)
MCHC: 33.7 g/dL (ref 31.5–35.7)
MCV: 90 fL (ref 79–97)
Monocytes Absolute: 0.4 10*3/uL (ref 0.1–0.9)
Monocytes: 5 %
Neutrophils Absolute: 3.6 10*3/uL (ref 1.4–7.0)
Neutrophils: 48 %
Platelets: 321 10*3/uL (ref 150–450)
RBC: 5.03 x10E6/uL (ref 3.77–5.28)
RDW: 12.6 % (ref 11.7–15.4)
WBC: 7.7 10*3/uL (ref 3.4–10.8)

## 2020-02-06 LAB — VITAMIN D 25 HYDROXY (VIT D DEFICIENCY, FRACTURES): Vit D, 25-Hydroxy: 27.5 ng/mL — ABNORMAL LOW (ref 30.0–100.0)

## 2020-02-06 LAB — URINALYSIS
Bilirubin, UA: NEGATIVE
Glucose, UA: NEGATIVE
Ketones, UA: NEGATIVE
Leukocytes,UA: NEGATIVE
Nitrite, UA: NEGATIVE
Protein,UA: NEGATIVE
Specific Gravity, UA: 1.016 (ref 1.005–1.030)
Urobilinogen, Ur: 0.2 mg/dL (ref 0.2–1.0)
pH, UA: 6 (ref 5.0–7.5)

## 2020-02-06 LAB — TSH: TSH: 0.837 u[IU]/mL (ref 0.450–4.500)

## 2020-02-06 LAB — VITAMIN B12: Vitamin B-12: 652 pg/mL (ref 232–1245)

## 2020-02-07 ENCOUNTER — Encounter: Payer: Self-pay | Admitting: Family Medicine

## 2020-02-07 ENCOUNTER — Other Ambulatory Visit: Payer: Self-pay | Admitting: Family Medicine

## 2020-02-07 MED ORDER — HYDROCHLOROTHIAZIDE 25 MG PO TABS
25.0000 mg | ORAL_TABLET | Freq: Every day | ORAL | 3 refills | Status: DC
  Filled 2020-09-18: qty 90, 90d supply, fill #0

## 2020-02-07 MED ORDER — ATORVASTATIN CALCIUM 20 MG PO TABS: 20.0000 mg | ORAL_TABLET | Freq: Every day | ORAL | 3 refills | Status: DC

## 2020-02-07 MED ORDER — BUPROPION HCL ER (SR) 150 MG PO TB12: 150.0000 mg | ORAL_TABLET | Freq: Two times a day (BID) | ORAL | 3 refills | Status: DC

## 2020-02-07 MED ORDER — CLONIDINE HCL 0.1 MG PO TABS
0.1000 mg | ORAL_TABLET | Freq: Three times a day (TID) | ORAL | 6 refills | Status: AC | PRN
  Filled 2020-08-18: qty 30, 10d supply, fill #0

## 2020-02-07 MED ORDER — CETIRIZINE HCL 10 MG PO TABS
10.0000 mg | ORAL_TABLET | Freq: Every day | ORAL | 3 refills | Status: DC
Start: 2020-02-07 — End: 2020-02-07

## 2020-02-07 MED ORDER — LISINOPRIL 40 MG PO TABS: 40.0000 mg | ORAL_TABLET | Freq: Every day | ORAL | 3 refills | Status: DC

## 2020-02-07 MED ORDER — NAPROXEN 500 MG PO TABS: 500.0000 mg | ORAL_TABLET | Freq: Two times a day (BID) | ORAL | 3 refills | Status: AC

## 2020-02-07 MED ORDER — ATENOLOL 50 MG PO TABS: 50.0000 mg | ORAL_TABLET | Freq: Every day | ORAL | 3 refills | Status: DC

## 2020-02-08 MED FILL — ?CETIRIZINE HCL 10 MG TABLE: 10 | 30 days supply | Qty: 30 | Fill #0

## 2020-02-08 MED FILL — ?ATORVASTATIN 20 MG TABLET: 20 | 30 days supply | Qty: 30 | Fill #0

## 2020-02-08 MED FILL — BUPROPION SR 150 MG TABLET: 150 | 30 days supply | Qty: 60 | Fill #0

## 2020-02-08 MED FILL — ?cloNIDine HCL 0.1 MG TABS: 0.1 | 10 days supply | Qty: 30 | Fill #0

## 2020-02-13 ENCOUNTER — Other Ambulatory Visit: Payer: Self-pay

## 2020-02-13 ENCOUNTER — Ambulatory Visit (INDEPENDENT_AMBULATORY_CARE_PROVIDER_SITE_OTHER): Payer: No Typology Code available for payment source | Admitting: Family Medicine

## 2020-02-13 VITALS — BP 173/90 | HR 71 | Resp 17

## 2020-02-13 DIAGNOSIS — I10 Essential (primary) hypertension: Secondary | ICD-10-CM

## 2020-02-13 NOTE — Progress Notes (Signed)
Normal result letter generated and sent via MyChart. 

## 2020-02-13 NOTE — Progress Notes (Signed)
Patient here for BP check. Has taken BP medications this morning. States that she has not had caffeine this morning. Is under some stress because she woke up to a flat tire. Has a slight headache this morning. Denies chest pain, SHOB, palpitations, dizziness, lower extremity swelling. After sitting, BP was 173/90, pulse was 71. Did not want to wait to see provider. Has follow up appointment scheduled 03/31/2020. KWalker, CMA.

## 2020-02-22 MED FILL — LISINOPRIL 40 MG TABS: 40 | 30 days supply | Qty: 30 | Fill #0

## 2020-02-22 MED FILL — ?ATENOLOL 50 MG TABLET: 50 | 30 days supply | Qty: 30 | Fill #0

## 2020-03-05 ENCOUNTER — Other Ambulatory Visit: Payer: Self-pay | Admitting: Family Medicine

## 2020-03-05 ENCOUNTER — Telehealth: Payer: Self-pay

## 2020-03-05 DIAGNOSIS — I10 Essential (primary) hypertension: Secondary | ICD-10-CM

## 2020-03-05 MED ORDER — ATENOLOL 100 MG PO TABS: 100.0000 mg | ORAL_TABLET | Freq: Every day | ORAL | 3 refills | Status: DC

## 2020-03-05 MED ORDER — ATENOLOL 100 MG PO TABS: 100.0000 mg | ORAL_TABLET | Freq: Every day | ORAL | 1 refills | Status: DC

## 2020-03-05 NOTE — Telephone Encounter (Signed)
CMA called patient to inform PCP change her Atenolol to 100mg , patient currently taking 50mg .  Pt. Is aware of new script and dose changes. Pt. Stated [place] and will take two of 50mg  of Atenolol until she come back to get her new script.

## 2020-03-06 MED FILL — ATENOLOL 100 MG TABLET: 100 | 30 days supply | Qty: 30 | Fill #0

## 2020-03-15 ENCOUNTER — Ambulatory Visit: Payer: No Typology Code available for payment source

## 2020-03-20 ENCOUNTER — Ambulatory Visit: Payer: Medicaid Other | Attending: Internal Medicine

## 2020-03-20 DIAGNOSIS — Z23 Encounter for immunization: Secondary | ICD-10-CM

## 2020-03-20 NOTE — Progress Notes (Signed)
   Covid-19 Vaccination Clinic  Name:  Mindee Robledo    MRN: [id number] DOB: September 09, 1975  03/20/2020  Ms. Koplin was observed post Covid-19 immunization for 15 minutes without incident. She was provided with Vaccine Information Sheet and instruction to access the V-Safe system.   Ms. Thorstenson was instructed to call 911 with any severe reactions post vaccine: Marland Kitchen Difficulty breathing  . Swelling of face and throat  . A fast heartbeat  . A bad rash all over body  . Dizziness and weakness   Immunizations Administered    Name Date Dose VIS Date Route   Pfizer COVID-19 Vaccine 03/20/2020  2:27 PM 0.3 mL 12/26/2019 Intramuscular   Manufacturer: [employer], [employer]   Lot: [id number]   NDC: [address]-9

## 2020-03-31 ENCOUNTER — Ambulatory Visit: Payer: No Typology Code available for payment source | Admitting: Family Medicine

## 2020-04-01 MED FILL — LISINOPRIL 40 MG TABLET: 40 | 30 days supply | Qty: 30 | Fill #1

## 2020-04-01 MED FILL — ?CETIRIZINE HCL 10 MG TABLE: 10 | 30 days supply | Qty: 30 | Fill #1

## 2020-04-01 MED FILL — ?ATENOLOL 50 MG TABLET: 50 | 30 days supply | Qty: 30 | Fill #1

## 2020-04-01 MED FILL — ?ATORVASTATIN 20 MG TABLET: 20 | 30 days supply | Qty: 30 | Fill #1

## 2020-04-30 MED FILL — ?ATENOLOL 50 MG TABLET: 50 | 30 days supply | Qty: 30 | Fill #2

## 2020-04-30 MED FILL — LISINOPRIL 40 MG TAB: 40 | 30 days supply | Qty: 30 | Fill #2

## 2020-04-30 MED FILL — ?CETIRIZINE HCL 10 MG TABLE: 10 | 30 days supply | Qty: 30 | Fill #2

## 2020-04-30 MED FILL — ?ATORVASTATIN 20 MG TABLET: 20 | 30 days supply | Qty: 30 | Fill #2

## 2020-04-30 MED FILL — ?NAPROXEN 500 MG TABS: 500 | 30 days supply | Qty: 60 | Fill #1

## 2020-05-20 ENCOUNTER — Ambulatory Visit: Payer: No Typology Code available for payment source | Admitting: Family Medicine

## 2020-05-31 ENCOUNTER — Ambulatory Visit (HOSPITAL_COMMUNITY)
Admission: EM | Admit: 2020-05-31 | Discharge: 2020-05-31 | Disposition: A | Discharge status: Home / Self Care | Payer: BC Managed Care – PPO | Attending: Emergency Medicine | Admitting: Emergency Medicine

## 2020-05-31 ENCOUNTER — Other Ambulatory Visit: Payer: Self-pay

## 2020-05-31 ENCOUNTER — Encounter (HOSPITAL_COMMUNITY): Payer: Self-pay

## 2020-05-31 DIAGNOSIS — I160 Hypertensive urgency: Secondary | ICD-10-CM

## 2020-05-31 DIAGNOSIS — M545 Low back pain, unspecified: Secondary | ICD-10-CM

## 2020-05-31 LAB — POCT URINALYSIS DIPSTICK, ED / UC
Bilirubin Urine: NEGATIVE
Glucose, UA: NEGATIVE mg/dL
Ketones, ur: NEGATIVE mg/dL
Leukocytes,Ua: NEGATIVE
Nitrite: NEGATIVE
Protein, ur: 30 mg/dL — AB
Specific Gravity, Urine: 1.025 (ref 1.005–1.030)
Urobilinogen, UA: 0.2 mg/dL (ref 0.0–1.0)
pH: 6.5 (ref 5.0–8.0)

## 2020-05-31 LAB — POC URINE PREG, ED: Preg Test, Ur: NEGATIVE

## 2020-05-31 NOTE — Discharge Instructions (Addendum)
Go to the emergency department for evaluation of your very high blood pressure. Your blood pressure is elevated today at 199/140; recheck 199/138.  Also please take your medications daily as directed and have your blood pressure rechecked by your primary care provider next week.     Take Tylenol or ibuprofen as needed for your back pain.  Your urine does not show signs of infection.

## 2020-05-31 NOTE — ED Triage Notes (Signed)
Patient is being discharged from the Urgent Care and sent to the Emergency Department via POV . Per Tresa Endo Tate-NP, patient is in need of higher level of care due to elevated BP. Patient is aware and verbalizes understanding of plan of care.  Vitals:   05/31/20 1111 05/31/20 1123  BP:  (!) 199/138  Pulse:    Resp:    Temp: 98.1 F (36.7 C)   SpO2:

## 2020-05-31 NOTE — ED Provider Notes (Signed)
[facility]    CSN: [id number] Arrival date & time: 05/31/20  1053      History   Chief Complaint Chief Complaint  Patient presents with  . Back Pain  . Urinary Frequency    HPI Jennifer Ayers is a 45 y.o. female.   Patient presents with 3-day history of right lower back pain.  She states she initially had some urinary frequency but this has resolved.  She also states she has not taken her blood pressure medication in 2 days because she was away from her home and did not have them with her.  She has not had her blood pressure medication today.  Patient denies dizziness, weakness, numbness, headache, chest pain, shortness of breath, abdominal pain, dysuria, vomiting, diarrhea, vaginal discharge, pelvic pain, or other symptoms.  No treatments attempted at home.  Her medical history includes hypertension, obesity, current everyday smoker, dyspnea, anxiety.  The history is provided by the patient and medical records.    Past Medical History:  Diagnosis Date  . Hypertension     Patient Active Problem List   Diagnosis Date Noted  . Class 2 severe obesity due to excess calories with serious comorbidity and body mass index (BMI) of 35.0 to 35.9 in adult (HCC) 03/15/2019  . Dental caries 05/22/2018  . Anxiety 05/22/2018  . Weight decrease 05/22/2018  . Precordial pain   . Cigar smoker 04/26/2017  . Cigarette smoker 04/26/2017  . Dyspnea on exertion 04/18/2017  . Accelerated hypertension 11/19/2016  . HTN (hypertension) 07/11/2016  . Right carotid bruit 07/11/2016    Past Surgical History:  Procedure Laterality Date  . CESAREAN SECTION    . LEFT HEART CATH AND CORONARY ANGIOGRAPHY N/A 04/28/2017   Procedure: LEFT HEART CATH AND CORONARY ANGIOGRAPHY;  Surgeon: Corky Crafts, MD;  Location: [facility] INVASIVE CV LAB;  Service: Cardiovascular;  Laterality: N/A;  . TONSILLECTOMY      OB History    Gravida  5   Para  1   Term  1   Preterm  0   AB  3   Living   1     SAB  2   IAB  1   Ectopic  0   Multiple  0   Live Births  1            Home Medications    Prior to Admission medications   Medication Sig Start Date End Date Taking? Authorizing Provider  acetaminophen (TYLENOL) 500 MG tablet Take 1 tablet (500 mg total) by mouth every 6 (six) hours as needed. Patient taking differently: Take 500 mg by mouth every 6 (six) hours as needed for moderate pain or headache.  11/18/16   Massie Maroon, FNP  atenolol (TENORMIN) 100 MG tablet Take 1 tablet (100 mg total) by mouth daily. 03/05/20   Kallie Locks, FNP  atorvastatin (LIPITOR) 20 MG tablet Take 1 tablet (20 mg total) by mouth daily. 02/07/20   Kallie Locks, FNP  buPROPion (WELLBUTRIN SR) 150 MG 12 hr tablet Take 1 tablet (150 mg total) by mouth 2 (two) times daily. 02/07/20   Kallie Locks, FNP  cetirizine (ZYRTEC) 10 MG tablet Take 1 tablet (10 mg total) by mouth daily. 02/07/20   Kallie Locks, FNP  cloNIDine (CATAPRES) 0.1 MG tablet Take 1 tablet (0.1 mg total) by mouth 3 (three) times daily as needed. Take only if BP > 140/90 02/07/20   Kallie Locks, FNP  etonogestrel [facility])  68 MG IMPL implant 1 each by Subdermal route once.    [provider]  hydrochlorothiazide (HYDRODIURIL) 25 MG tablet Take 1 tablet (25 mg total) by mouth daily. 02/07/20   Kallie Locks, FNP  lactulose (CHRONULAC) 10 GM/15ML solution Take 15 mLs (10 g total) by mouth 3 (three) times daily. 09/11/18   Kallie Locks, FNP  lisinopril (ZESTRIL) 40 MG tablet Take 1 tablet (40 mg total) by mouth daily. 02/07/20   Kallie Locks, FNP  naproxen (NAPROSYN) 500 MG tablet Take 1 tablet (500 mg total) by mouth 2 (two) times daily with a meal. 02/07/20   Kallie Locks, FNP  nitroGLYCERIN (NITROSTAT) 0.4 MG SL tablet Place 1 tablet (0.4 mg total) under the tongue every 5 (five) minutes as needed for chest pain. Patient not taking: Reported on 02/05/2020 05/22/18   Kallie Locks, FNP    Family History Family History  Problem Relation Age of Onset  . Diabetes Mother   . Hypertension Mother   . Diabetes Father   . Hypertension Father     Social History Social History   Tobacco Use  . Smoking status: Current Every Day Smoker    Years: 5.00    Types: Cigarettes  . Smokeless tobacco: Never Used  Vaping Use  . Vaping Use: Never used  Substance Use Topics  . Alcohol use: No  . Drug use: No     Allergies   Amlodipine   Review of Systems Review of Systems  Constitutional: Negative for chills and fever.  HENT: Negative for ear pain and sore throat.   Eyes: Negative for pain and visual disturbance.  Respiratory: Negative for cough and shortness of breath.   Cardiovascular: Negative for chest pain and palpitations.  Gastrointestinal: Negative for abdominal pain and vomiting.  Genitourinary: Positive for frequency. Negative for dysuria and hematuria.  Musculoskeletal: Positive for back pain. Negative for arthralgias.  Skin: Negative for color change and rash.  Neurological: Negative for seizures and syncope.  All other systems reviewed and are negative.    Physical Exam Triage Vital Signs ED Triage Vitals  Enc Vitals Group     BP      Pulse      Resp      Temp      Temp src      SpO2      Weight      Height      Head Circumference      Peak Flow      Pain Score      Pain Loc      Pain Edu?      Excl. in [place]?    No data found.  Updated Vital Signs BP (!) 199/138 (BP Location: Left Arm)   Pulse 77   Temp 98.1 F (36.7 C)   Resp 19   SpO2 98%   Visual Acuity Right Eye Distance:   Left Eye Distance:   Bilateral Distance:    Right Eye Near:   Left Eye Near:    Bilateral Near:     Physical Exam Vitals and nursing note reviewed.  Constitutional:      General: She is not in acute distress.    Appearance: She is well-developed. She is not ill-appearing.  HENT:     Head: Normocephalic and atraumatic.     Mouth/Throat:      Mouth: Mucous membranes are moist.  Eyes:     Conjunctiva/sclera: Conjunctivae normal.  Cardiovascular:  Rate and Rhythm: Normal rate and regular rhythm.     Heart sounds: Normal heart sounds.  Pulmonary:     Effort: Pulmonary effort is normal. No respiratory distress.     Breath sounds: Normal breath sounds.  Abdominal:     Palpations: Abdomen is soft.     Tenderness: There is no abdominal tenderness. There is no right CVA tenderness, left CVA tenderness, guarding or rebound.  Musculoskeletal:        General: No swelling or tenderness. Normal range of motion.     Cervical back: Neck supple.     Right lower leg: No edema.     Left lower leg: No edema.  Skin:    General: Skin is warm and dry.     Findings: No bruising, erythema, lesion or rash.  Neurological:     General: No focal deficit present.     Mental Status: She is alert and oriented to person, place, and time.     Sensory: No sensory deficit.     Motor: No weakness.     Gait: Gait normal.  Psychiatric:        Mood and Affect: Mood normal.        Behavior: Behavior normal.      UC Treatments / Results  Labs (all labs ordered are listed, but only abnormal results are displayed) Labs Reviewed  POCT URINALYSIS DIPSTICK, ED / UC - Abnormal; Notable for the following components:      Result Value   Hgb urine dipstick MODERATE (*)    Protein, ur 30 (*)    All other components within normal limits  POC URINE PREG, ED    EKG   Radiology No results found.  Procedures Procedures (including critical care time)  Medications Ordered in UC Medications - No data to display  Initial Impression / Assessment and Plan / UC Course  I have reviewed the triage vital signs and the nursing notes.  Pertinent labs & imaging results that were available during my care of the patient were reviewed by me and considered in my medical decision making (see chart for details).   Hypertensive urgency.  Acute right lower back  pain without sciatica.  Sending patient to the ED for evaluation.  Discussed that she should take her medications daily as directed.  Instructed her to follow-up with her PCP next week.  Discussed Tylenol or ibuprofen as needed for her back pain.  Urine does not show signs of infection.  Urine pregnancy negative.  Patient agrees to plan of care.   Final Clinical Impressions(s) / UC Diagnoses   Final diagnoses:  Hypertensive urgency  Acute right-sided low back pain without sciatica     Discharge Instructions     Go to the emergency department for evaluation of your very high blood pressure. Your blood pressure is elevated today at 199/140; recheck 199/138.  Also please take your medications daily as directed and have your blood pressure rechecked by your primary care provider next week.     Take Tylenol or ibuprofen as needed for your back pain.  Your urine does not show signs of infection.     ED Prescriptions    None     PDMP not reviewed this encounter.   Mickie Bail, NP 05/31/20 1135

## 2020-05-31 NOTE — ED Triage Notes (Signed)
Pt in with c/o right lower back pain and urinary frequency that has been going on for about 3 days

## 2020-06-07 ENCOUNTER — Other Ambulatory Visit: Payer: Self-pay

## 2020-06-08 ENCOUNTER — Other Ambulatory Visit: Payer: Self-pay

## 2020-06-08 MED ORDER — NAPROXEN 500 MG PO TABS
500.0000 mg | ORAL_TABLET | Freq: Two times a day (BID) | ORAL | 3 refills | Status: AC
  Filled 2020-06-08: qty 60, 30d supply, fill #0
  Filled 2020-09-18: qty 60, 30d supply, fill #1

## 2020-06-08 MED FILL — Lisinopril Tab 40 MG: ORAL | 30 days supply | Qty: 30 | Fill #0 | Status: AC

## 2020-06-08 MED FILL — Atenolol Tab 50 MG: ORAL | 30 days supply | Qty: 30 | Fill #0 | Status: AC

## 2020-06-08 MED FILL — Atorvastatin Calcium Tab 20 MG (Base Equivalent): ORAL | 30 days supply | Qty: 30 | Fill #0 | Status: AC

## 2020-06-08 MED FILL — Cetirizine HCl Tab 10 MG: ORAL | 30 days supply | Qty: 30 | Fill #0 | Status: AC

## 2020-06-09 ENCOUNTER — Other Ambulatory Visit: Payer: Self-pay

## 2020-06-10 ENCOUNTER — Other Ambulatory Visit: Payer: Self-pay

## 2020-06-12 ENCOUNTER — Other Ambulatory Visit: Payer: Self-pay

## 2020-07-10 MED FILL — Cetirizine HCl Tab 10 MG: ORAL | 30 days supply | Qty: 30 | Fill #1 | Status: CN

## 2020-07-10 MED FILL — Lisinopril Tab 40 MG: ORAL | 30 days supply | Qty: 30 | Fill #1 | Status: AC

## 2020-07-10 MED FILL — Atenolol Tab 50 MG: ORAL | 30 days supply | Qty: 30 | Fill #1 | Status: AC

## 2020-07-10 MED FILL — Atorvastatin Calcium Tab 20 MG (Base Equivalent): ORAL | 30 days supply | Qty: 30 | Fill #1 | Status: AC

## 2020-07-11 ENCOUNTER — Other Ambulatory Visit: Payer: Self-pay

## 2020-07-14 ENCOUNTER — Other Ambulatory Visit: Payer: Self-pay

## 2020-07-15 ENCOUNTER — Other Ambulatory Visit: Payer: Self-pay

## 2020-07-31 ENCOUNTER — Other Ambulatory Visit: Payer: Self-pay | Admitting: Family Medicine

## 2020-07-31 ENCOUNTER — Other Ambulatory Visit: Payer: Self-pay | Admitting: *Deleted

## 2020-07-31 DIAGNOSIS — Z1231 Encounter for screening mammogram for malignant neoplasm of breast: Secondary | ICD-10-CM

## 2020-08-05 ENCOUNTER — Ambulatory Visit: Payer: No Typology Code available for payment source

## 2020-08-12 ENCOUNTER — Ambulatory Visit
Admission: RE | Admit: 2020-08-12 | Discharge: 2020-08-12 | Disposition: A | Discharge status: Home / Self Care | Payer: BC Managed Care – PPO | Source: Ambulatory Visit | Attending: *Deleted | Admitting: *Deleted

## 2020-08-12 ENCOUNTER — Other Ambulatory Visit: Payer: Self-pay

## 2020-08-12 DIAGNOSIS — Z1231 Encounter for screening mammogram for malignant neoplasm of breast: Secondary | ICD-10-CM

## 2020-08-18 ENCOUNTER — Other Ambulatory Visit: Payer: Self-pay

## 2020-08-18 MED FILL — Atenolol Tab 50 MG: ORAL | 30 days supply | Qty: 30 | Fill #2 | Status: AC

## 2020-08-18 MED FILL — Atorvastatin Calcium Tab 20 MG (Base Equivalent): ORAL | 30 days supply | Qty: 30 | Fill #2 | Status: AC

## 2020-08-18 MED FILL — Lisinopril Tab 40 MG: ORAL | 30 days supply | Qty: 30 | Fill #2 | Status: AC

## 2020-09-18 ENCOUNTER — Other Ambulatory Visit: Payer: Self-pay

## 2020-09-18 MED FILL — Atenolol Tab 50 MG: ORAL | 30 days supply | Qty: 30 | Fill #3 | Status: AC

## 2020-09-18 MED FILL — Atorvastatin Calcium Tab 20 MG (Base Equivalent): ORAL | 30 days supply | Qty: 30 | Fill #3 | Status: AC

## 2020-09-22 ENCOUNTER — Other Ambulatory Visit: Payer: Self-pay

## 2020-10-08 MED FILL — Lisinopril Tab 40 MG: ORAL | 30 days supply | Qty: 30 | Fill #3 | Status: AC

## 2020-10-09 ENCOUNTER — Other Ambulatory Visit: Payer: Self-pay

## 2020-10-10 ENCOUNTER — Other Ambulatory Visit: Payer: Self-pay

## 2020-10-28 MED FILL — Atenolol Tab 50 MG: ORAL | 30 days supply | Qty: 30 | Fill #4 | Status: AC

## 2020-10-28 MED FILL — Atorvastatin Calcium Tab 20 MG (Base Equivalent): ORAL | 30 days supply | Qty: 30 | Fill #4 | Status: AC

## 2020-10-29 ENCOUNTER — Other Ambulatory Visit: Payer: Self-pay

## 2020-10-31 ENCOUNTER — Other Ambulatory Visit: Payer: Self-pay

## 2020-11-19 MED FILL — Lisinopril Tab 40 MG: ORAL | 30 days supply | Qty: 30 | Fill #4 | Status: AC

## 2020-11-20 ENCOUNTER — Other Ambulatory Visit: Payer: Self-pay

## 2020-11-24 ENCOUNTER — Other Ambulatory Visit: Payer: Self-pay

## 2020-11-24 MED FILL — Atorvastatin Calcium Tab 20 MG (Base Equivalent): ORAL | 30 days supply | Qty: 30 | Fill #5 | Status: AC

## 2020-11-24 MED FILL — Atenolol Tab 50 MG: ORAL | 30 days supply | Qty: 30 | Fill #5 | Status: AC

## 2021-01-06 ENCOUNTER — Other Ambulatory Visit: Payer: Self-pay

## 2021-01-06 MED FILL — Atorvastatin Calcium Tab 20 MG (Base Equivalent): ORAL | 30 days supply | Qty: 30 | Fill #6 | Status: AC

## 2021-01-06 MED FILL — Atenolol Tab 50 MG: ORAL | 30 days supply | Qty: 30 | Fill #6 | Status: AC

## 2021-01-06 MED FILL — Lisinopril Tab 40 MG: ORAL | 30 days supply | Qty: 30 | Fill #5 | Status: AC

## 2021-01-08 ENCOUNTER — Other Ambulatory Visit: Payer: Self-pay

## 2021-02-10 ENCOUNTER — Ambulatory Visit (HOSPITAL_COMMUNITY)
Admission: EM | Admit: 2021-02-10 | Discharge: 2021-02-10 | Disposition: A | Discharge status: Home / Self Care | Payer: BC Managed Care – PPO | Attending: Family Medicine | Admitting: Family Medicine

## 2021-02-10 ENCOUNTER — Encounter (HOSPITAL_COMMUNITY): Payer: Self-pay | Admitting: Emergency Medicine

## 2021-02-10 ENCOUNTER — Other Ambulatory Visit: Payer: Self-pay

## 2021-02-10 DIAGNOSIS — I10 Essential (primary) hypertension: Secondary | ICD-10-CM | POA: Diagnosis not present

## 2021-02-10 DIAGNOSIS — R109 Unspecified abdominal pain: Secondary | ICD-10-CM

## 2021-02-10 DIAGNOSIS — M5442 Lumbago with sciatica, left side: Secondary | ICD-10-CM

## 2021-02-10 DIAGNOSIS — M5432 Sciatica, left side: Secondary | ICD-10-CM

## 2021-02-10 LAB — POCT URINALYSIS DIPSTICK, ED / UC
Bilirubin Urine: NEGATIVE
Glucose, UA: NEGATIVE mg/dL
Ketones, ur: NEGATIVE mg/dL
Leukocytes,Ua: NEGATIVE
Nitrite: NEGATIVE
Protein, ur: NEGATIVE mg/dL
Specific Gravity, Urine: 1.02 (ref 1.005–1.030)
Urobilinogen, UA: 1 mg/dL (ref 0.0–1.0)
pH: 7 (ref 5.0–8.0)

## 2021-02-10 NOTE — Discharge Instructions (Addendum)
You have chronic left sided flank pain and left leg pain, likely due to a lumbar spine issue.  Your urine test today did not show any cause for this pain.  I recommend you take the tramadol that your primary care provider has sent to the pharmacy.  I recommend you trial heat/ice as well for the pain.  Please follow up with your PCP if not improving and for continued care.  Your blood pressure is also a bit elevated today.  Please continue to monitor and follow up with your PCP for this.

## 2021-02-10 NOTE — ED Triage Notes (Signed)
Pt presents with low back pain that radiates into left leg and hip xs 1 week.

## 2021-02-10 NOTE — ED Provider Notes (Signed)
[place]    CSN: [id number] Arrival date & time: 02/10/21  1519      History   Chief Complaint Chief Complaint  Patient presents with   Back Pain   Leg Pain    HPI Jennifer Ayers is a 45 y.o. female. She is having pain at the left low back, left hip, and into the left leg.  It has worsened the last several days;  constant pain, but worse when on her feet/walking.  She did have some numbness/tingling earlier in her leg, but that has resolved;  now just painful;  she has taken a muscle relaxer, but did not really help;  she was given tramadol from her pcp, but has not picked this up yet.  She did finish a medrol dose pack last week for this pain with little help.   She is worried about her kidneys, and would like a urine to be done.   Her bp is elevated today;  she did take her meds about 2 hrs ago;     Back Pain Associated symptoms: leg pain   Leg Pain Associated symptoms: back pain    Past Medical History:  Diagnosis Date   Hypertension     Patient Active Problem List   Diagnosis Date Noted   Class 2 severe obesity due to excess calories with serious comorbidity and body mass index (BMI) of 35.0 to 35.9 in adult [facility]) 03/15/2019   Dental caries 05/22/2018   Anxiety 05/22/2018   Weight decrease 05/22/2018   Precordial pain    Cigar smoker 04/26/2017   Cigarette smoker 04/26/2017   Dyspnea on exertion 04/18/2017   Accelerated hypertension 11/19/2016   HTN (hypertension) 07/11/2016   Right carotid bruit 07/11/2016    Past Surgical History:  Procedure Laterality Date   CESAREAN SECTION     LEFT HEART CATH AND CORONARY ANGIOGRAPHY N/A 04/28/2017   Procedure: LEFT HEART CATH AND CORONARY ANGIOGRAPHY;  Surgeon: Jettie Booze, MD;  Location: [place] CV LAB;  Service: Cardiovascular;  Laterality: N/A;   TONSILLECTOMY      OB History     Gravida  5   Para  1   Term  1   Preterm  0   AB  3   Living  1      SAB  2   IAB  1    Ectopic  0   Multiple  0   Live Births  1            Home Medications    Prior to Admission medications   Medication Sig Start Date End Date Taking? Authorizing Provider  acetaminophen (TYLENOL) 500 MG tablet Take 1 tablet (500 mg total) by mouth every 6 (six) hours as needed. Patient taking differently: Take 500 mg by mouth every 6 (six) hours as needed for moderate pain or headache.  11/18/16   Dorena Dew, FNP  atenolol (TENORMIN) 100 MG tablet Take 1 tablet (100 mg total) by mouth daily. 03/05/20   Azzie Glatter, FNP  atenolol (TENORMIN) 50 MG tablet TAKE 1 TABLET (50 MG TOTAL) BY MOUTH DAILY. 02/07/20 02/07/21  Azzie Glatter, FNP  atorvastatin (LIPITOR) 20 MG tablet TAKE 1 TABLET (20 MG TOTAL) BY MOUTH DAILY. 02/07/20 02/07/21  Azzie Glatter, FNP  buPROPion (WELLBUTRIN SR) 150 MG 12 hr tablet TAKE 1 TABLET (150 MG TOTAL) BY MOUTH 2 (TWO) TIMES DAILY. 02/07/20 02/06/21  Azzie Glatter, FNP  cetirizine (ZYRTEC) 10 MG tablet  TAKE 1 TABLET (10 MG TOTAL) BY MOUTH DAILY. 02/07/20 02/06/21  Azzie Glatter, FNP  cloNIDine (CATAPRES) 0.1 MG tablet Take 1 tablet (0.1 mg total) by mouth 3 (three) times daily as needed. Take only if BP > 140/90 02/08/20   Azzie Glatter, FNP  etonogestrel (NEXPLANON) 68 MG IMPL implant 1 each by Subdermal route once.    [provider]  hydrochlorothiazide (HYDRODIURIL) 25 MG tablet Take 1 tablet (25 mg total) by mouth daily. 02/07/20   Azzie Glatter, FNP  lactulose (CHRONULAC) 10 GM/15ML solution Take 15 mLs (10 g total) by mouth 3 (three) times daily. 09/11/18   Azzie Glatter, FNP  lisinopril (ZESTRIL) 40 MG tablet TAKE 1 TABLET (40 MG TOTAL) BY MOUTH DAILY. 02/07/20 02/07/21  Azzie Glatter, FNP  naproxen (NAPROSYN) 500 MG tablet Take 1 tablet (500 mg total) by mouth 2 (two) times daily with a meal. 02/07/20   Azzie Glatter, FNP  nitroGLYCERIN (NITROSTAT) 0.4 MG SL tablet Place 1 tablet (0.4 mg total) under the tongue every  5 (five) minutes as needed for chest pain. Patient not taking: Reported on 02/05/2020 05/22/18   Azzie Glatter, FNP    Family History Family History  Problem Relation Age of Onset   Diabetes Mother    Hypertension Mother    Diabetes Father    Hypertension Father     Social History Social History   Tobacco Use   Smoking status: Every Day    Years: 5.00    Types: Cigarettes   Smokeless tobacco: Never  Vaping Use   Vaping Use: Never used  Substance Use Topics   Alcohol use: No   Drug use: No     Allergies   Amlodipine   Review of Systems Review of Systems  Constitutional: Negative.   HENT: Negative.    Respiratory: Negative.    Cardiovascular: Negative.   Gastrointestinal: Negative.   Genitourinary: Negative.   Musculoskeletal:  Positive for back pain.    Physical Exam Triage Vital Signs ED Triage Vitals  Enc Vitals Group     BP 02/10/21 1539 (!) 155/102     Pulse Rate 02/10/21 1539 79     Resp 02/10/21 1539 17     Temp 02/10/21 1539 98.4 F (36.9 C)     Temp Source 02/10/21 1539 Oral     SpO2 02/10/21 1539 96 %     Weight --      Height --      Head Circumference --      Peak Flow --      Pain Score 02/10/21 1536 10     Pain Loc --      Pain Edu? --      Excl. in [place]? --    No data found.  Updated Vital Signs BP (!) 155/102 (BP Location: Left Arm)   Pulse 79   Temp 98.4 F (36.9 C) (Oral)   Resp 17   SpO2 96%      Physical Exam Constitutional:      Appearance: Normal appearance.  HENT:     Head: Normocephalic.  Cardiovascular:     Rate and Rhythm: Normal rate and regular rhythm.  Pulmonary:     Effort: Pulmonary effort is normal.     Breath sounds: Normal breath sounds.  Neurological:     Mental Status: She is alert.  Psychiatric:        Mood and Affect: Mood normal.     UC Treatments /  Results  Labs (all labs ordered are listed, but only abnormal results are displayed) Labs Reviewed  POCT URINALYSIS DIPSTICK, ED / UC -  Abnormal; Notable for the following components:      Result Value   Hgb urine dipstick TRACE (*)    All other components within normal limits    EKG   Radiology No results found.  Procedures Procedures (including critical care time)  Medications Ordered in UC Medications - No data to display  Initial Impression / Assessment and Plan / UC Course  I have reviewed the triage vital signs and the nursing notes.  Pertinent labs & imaging results that were available during my care of the patient were reviewed by me and considered in my medical decision making (see chart for details).     Final Clinical Impressions(s) / UC Diagnoses   Final diagnoses:  Acute left-sided low back pain with left-sided sciatica  Sciatica of left side  Left flank pain  Essential hypertension, benign     Discharge Instructions      You have chronic left sided flank pain and left leg pain, likely due to a lumbar spine issue.  Your urine test today did not show any cause for this pain.  I recommend you take the tramadol that your primary care provider has sent to the pharmacy.  I recommend you trial heat/ice as well for the pain.  Please follow up with your PCP if not improving and for continued care.  Your blood pressure is also a bit elevated today.  Please continue to monitor and follow up with your PCP for this.      ED Prescriptions   None    PDMP not reviewed this encounter.   Jannifer Franklin, MD 02/10/21 [phone]

## 2021-02-19 ENCOUNTER — Other Ambulatory Visit: Payer: Self-pay | Admitting: Nurse Practitioner

## 2021-02-19 DIAGNOSIS — I10 Essential (primary) hypertension: Secondary | ICD-10-CM

## 2021-02-20 ENCOUNTER — Other Ambulatory Visit: Payer: Self-pay

## 2021-03-12 ENCOUNTER — Ambulatory Visit: Payer: BC Managed Care – PPO | Admitting: Surgery

## 2021-09-28 IMAGING — MG MM DIGITAL SCREENING BILAT W/ TOMO AND CAD
8 series · 8 of 24 positions shown · non-contrast
Comparison: Previous exam(s).

CLINICAL DATA: Screening.

EXAM:
DIGITAL SCREENING BILATERAL MAMMOGRAM WITH TOMOSYNTHESIS AND CAD
TECHNIQUE: Bilateral screening digital craniocaudal and mediolateral oblique
mammograms were obtained. Bilateral screening digital breast
tomosynthesis was performed. The images were evaluated with
computer-aided detection.

[R CC synth-2D]
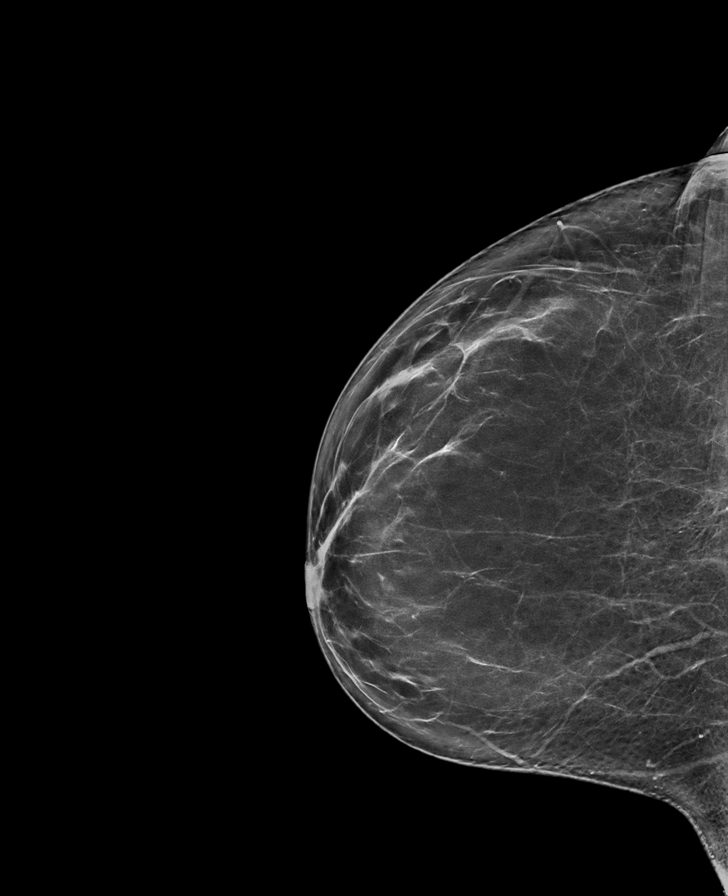

[R MLO synth-2D]
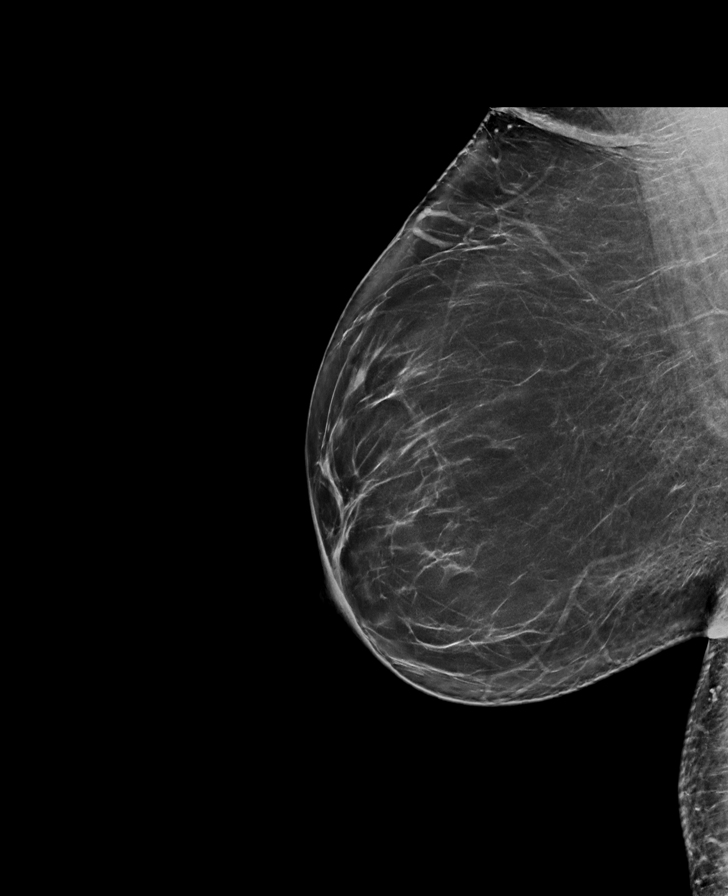

[L CC synth-2D]
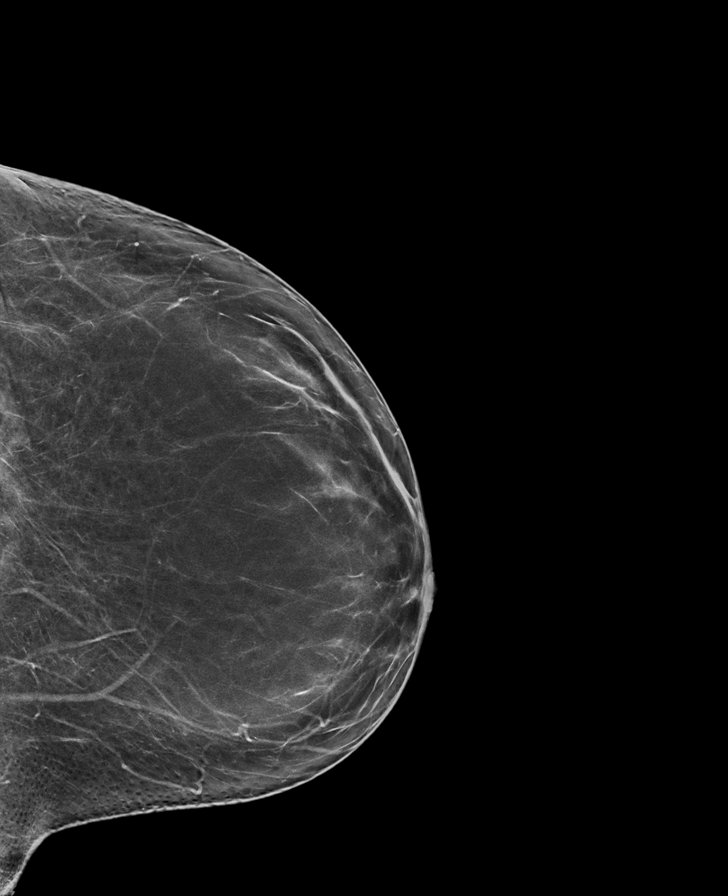

[L MLO synth-2D]
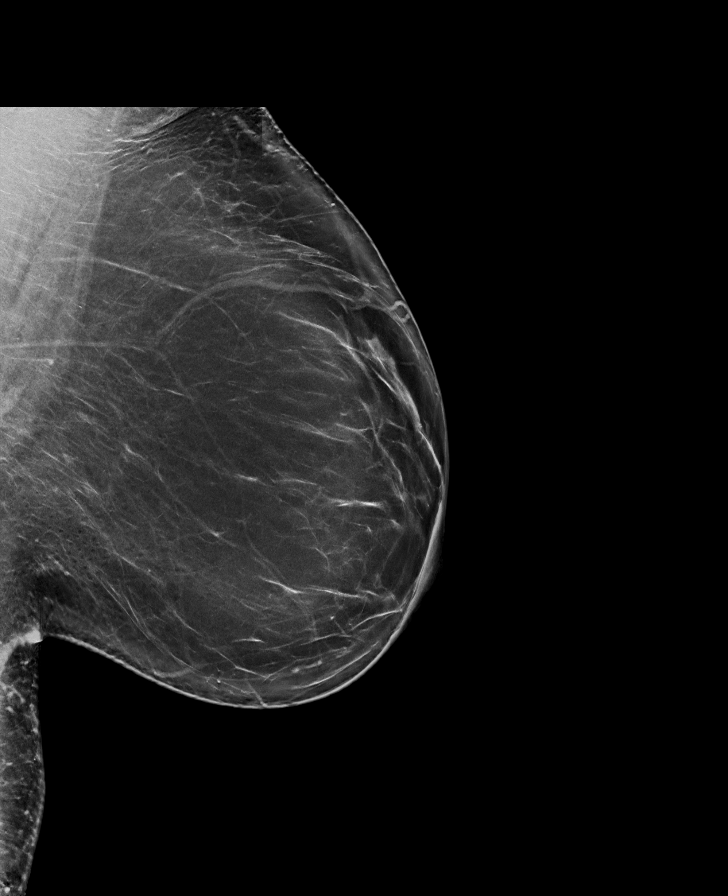

[R MLO tomo · tomo slice 43/84.0]
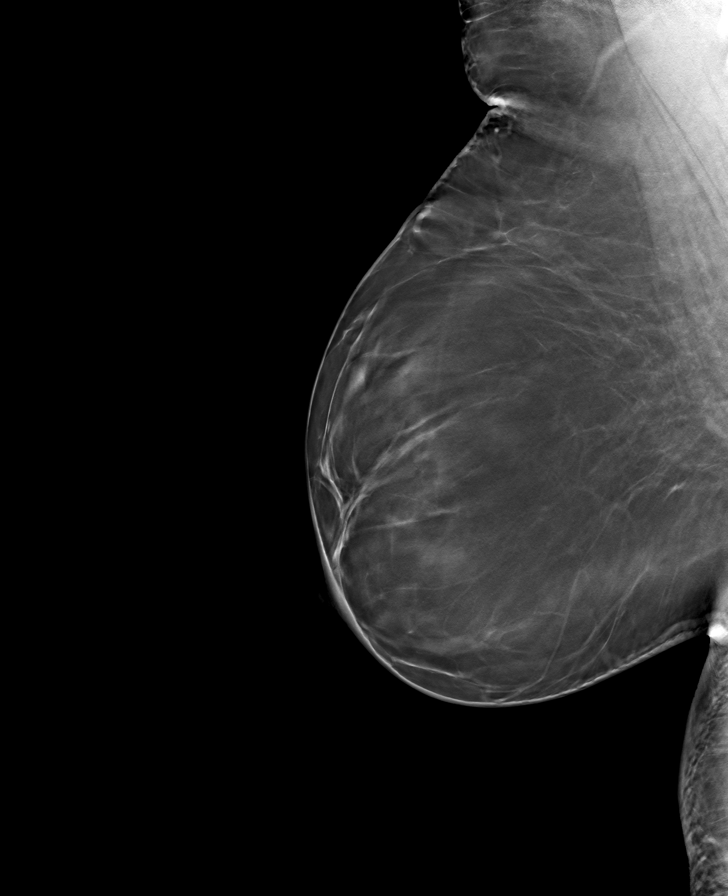

[L CC tomo · tomo slice 39/77.0]
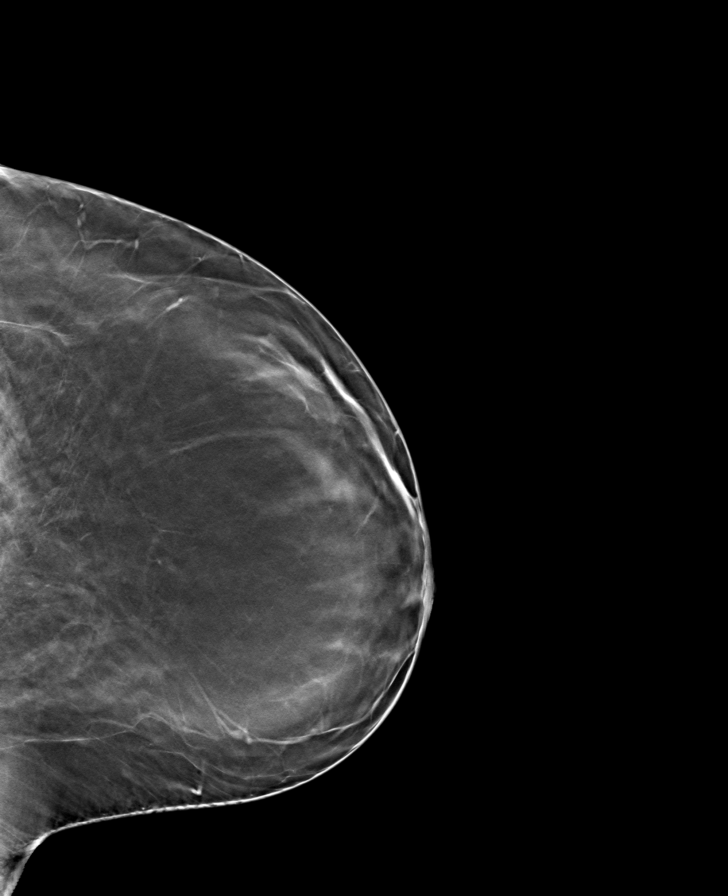

[L MLO tomo · tomo slice 45/89.0]
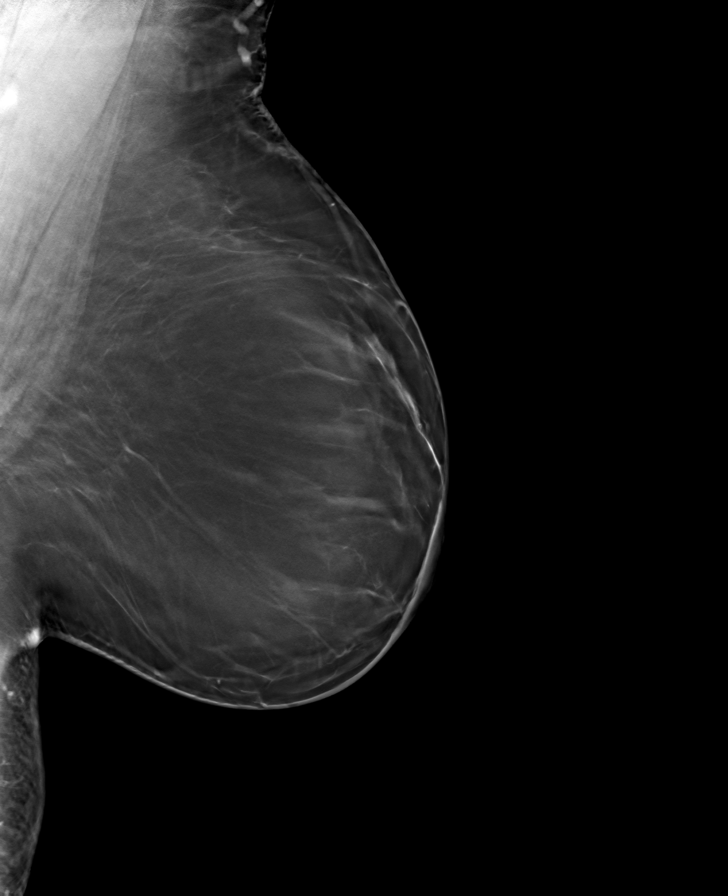

[R CC tomo · tomo slice 39/78.0]
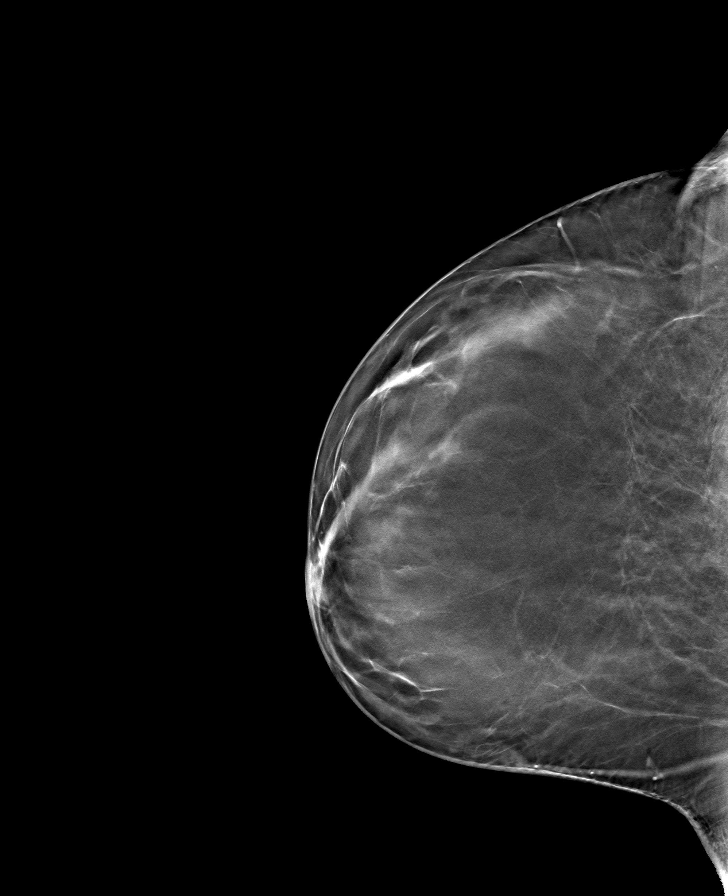

[8 of 24 positions shown; findings below may reference images not displayed]

ACR Breast Density Category b: There are scattered areas of
fibroglandular density.
FINDINGS: There are no findings suspicious for malignancy. The images were
evaluated with computer-aided detection.
IMPRESSION: No mammographic evidence of malignancy. A result letter of this
screening mammogram will be mailed directly to the patient.

RECOMMENDATION:
Screening mammogram in one year. (Code:[id number]-I-BG6)

BI-RADS CATEGORY  1: Negative.

## 2022-02-05 ENCOUNTER — Ambulatory Visit: Payer: BC Managed Care – PPO | Admitting: Nurse Practitioner

## 2022-02-05 ENCOUNTER — Encounter: Payer: Self-pay | Admitting: Nurse Practitioner

## 2022-02-05 VITALS — BP 129/68 | HR 72 | Temp 97.3°F | Ht 61.0 in | Wt 185.6 lb

## 2022-02-05 DIAGNOSIS — Z1211 Encounter for screening for malignant neoplasm of colon: Secondary | ICD-10-CM

## 2022-02-05 DIAGNOSIS — I10 Essential (primary) hypertension: Secondary | ICD-10-CM | POA: Diagnosis not present

## 2022-02-05 DIAGNOSIS — Z716 Tobacco abuse counseling: Secondary | ICD-10-CM

## 2022-02-05 DIAGNOSIS — E782 Mixed hyperlipidemia: Secondary | ICD-10-CM | POA: Diagnosis not present

## 2022-02-05 DIAGNOSIS — E785 Hyperlipidemia, unspecified: Secondary | ICD-10-CM | POA: Insufficient documentation

## 2022-02-05 DIAGNOSIS — Z1231 Encounter for screening mammogram for malignant neoplasm of breast: Secondary | ICD-10-CM

## 2022-02-05 DIAGNOSIS — J3089 Other allergic rhinitis: Secondary | ICD-10-CM

## 2022-02-05 DIAGNOSIS — Z131 Encounter for screening for diabetes mellitus: Secondary | ICD-10-CM

## 2022-02-05 DIAGNOSIS — Z6835 Body mass index (BMI) 35.0-35.9, adult: Secondary | ICD-10-CM

## 2022-02-05 MED ORDER — CETIRIZINE HCL 10 MG PO TABS: ORAL_TABLET | Freq: Every day | ORAL | 3 refills | Status: AC

## 2022-02-05 NOTE — Assessment & Plan Note (Signed)
Currently taking OTC Allegra Cetirizine works better for her Cetirizine 10 mg daily reordered today

## 2022-02-05 NOTE — Assessment & Plan Note (Signed)
BP Readings from Last 3 Encounters:  02/05/22 129/68  02/10/21 (!) 155/102  05/31/20 (!) 199/138   Currently on clonidine 0.1 mg by mouth 3 times daily as needed if BP is greater than 140/90, hydralazine 100 mg 3 times daily, hydrochlorothiazide 25 mg daily, atenolol 100 mg daily Blood pressure well-controlled Continue current medications DASH diet advised patient encouraged to engage in regular moderate exercises at least 150 minutes weekly Appreciate collaboration with nephrology CMP today

## 2022-02-05 NOTE — Progress Notes (Signed)
New Patient Office Visit  Subjective:  Patient ID: Jennifer Ayers, female    DOB: 21-Oct-1975  Age: 46 y.o. MRN: [id number]  CC:  Chief Complaint  Patient presents with   Medication Refill    HPI Jennifer Ayers is a 46 y.o. female with past medical history of hypertension, obesity, tobacco abuse, hyperlipidemia who who presents to establish care for her chronic medical conditions.  Current PCP is Dr Antonietta Jewel in [place], last visit was a month ago.  She is also followed by nephrology at [facility] for her hypertension.  She is planning on getting her abdominal fat recently removed by specialist at [facility].  States that she was seen by them a year ago and the procedure was not done because of her uncontrolled hypertension.  She stays active by working long hours, she also eats lots of vegetables   Hypertension.  Currently on clonidine 0.1 mg by mouth 3 times daily as needed if BP is greater than 140/90, hydralazine 100 mg 3 times daily, hydrochlorothiazide 25 mg daily, atenolol 100 mg daily.  Patient denies chest pain, dizziness, edema  Currently smokes 6 cigarettes daily, started smoking at age 64, patient denies shortness of breath, wheezing, cough.  She is working on cutting back.  Due for Tdap vaccine and flu vaccine vaccines , she declined the vaccines she was encouraged to consider getting the vaccinations.  Due for colon cancer screening Cologuard ordered, patient referred for screening mammogram.  Plans for Pap exam at next visit.   Past Medical History:  Diagnosis Date   Hyperlipidemia    Hypertension     Past Surgical History:  Procedure Laterality Date   CESAREAN SECTION     LEFT HEART CATH AND CORONARY ANGIOGRAPHY N/A 04/28/2017   Procedure: LEFT HEART CATH AND CORONARY ANGIOGRAPHY;  Surgeon: Jettie Booze, MD;  Location: [facility];  Service: Cardiovascular;  Laterality: N/A;   TONSILLECTOMY      Family History  Problem Relation  Age of Onset   Hypertension Mother    Hypertension Father    Colon cancer Neg Hx    Breast cancer Neg Hx     Social History   Socioeconomic History   Marital status: Single    Spouse name: Not on file   Number of children: 1   Years of education: Not on file   Highest education level: Not on file  Occupational History   Not on file  Tobacco Use   Smoking status: Every Day    Years: 5.00    Types: Cigarettes   Smokeless tobacco: Never  Vaping Use   Vaping Use: Never used  Substance and Sexual Activity   Alcohol use: No   Drug use: No   Sexual activity: Yes    Birth control/protection: None  Other Topics Concern   Not on file  Social History Narrative   Lives with her boyfriend.    Social Determinants of Health   Financial Resource Strain: Low Risk  (02/20/2018)   Overall Financial Resource Strain (CARDIA)    Difficulty of Paying Living Expenses: Not hard at all  Food Insecurity: No Food Insecurity (02/20/2018)   Hunger Vital Sign    Worried About Running Out of Food in the Last Year: Never true    Ran Out of Food in the Last Year: Never true  Transportation Needs: No Transportation Needs (02/20/2018)   PRAPARE - Transportation    Lack of Transportation (Medical): No    Lack of  Transportation (Non-Medical): No  Physical Activity: Insufficiently Active (02/20/2018)   Exercise Vital Sign    Days of Exercise per Week: 1 day    Minutes of Exercise per Session: 20 min  Stress: No Stress Concern Present (02/20/2018)   [place]    Feeling of Stress : Not at all  Social Connections: Not on file  Intimate Partner Violence: Not At Risk (02/20/2018)   Humiliation, Afraid, Rape, and Kick questionnaire    Fear of Current or Ex-Partner: No    Emotionally Abused: No    Physically Abused: No    Sexually Abused: No    ROS Review of Systems  Constitutional:  Negative for appetite change, chills, fatigue  and fever.  Respiratory:  Negative for apnea, choking, chest tightness, shortness of breath and wheezing.   Cardiovascular: Negative.  Negative for chest pain, palpitations and leg swelling.  Gastrointestinal:  Negative for abdominal distention, abdominal pain and anal bleeding.  Neurological:  Negative for dizziness, facial asymmetry, light-headedness and numbness.  Psychiatric/Behavioral:  Negative for agitation, behavioral problems, confusion and dysphoric mood.     Objective:   Today's Vitals: BP 129/68   Pulse 72   Temp (!) 97.3 F (36.3 C)   Ht _0  (1.549 m)   Wt 185 lb 9.6 oz (84.2 kg)   SpO2 100%   BMI 35.07 kg/m   Physical Exam Constitutional:      General: She is not in acute distress.    Appearance: She is obese. She is not ill-appearing, toxic-appearing or diaphoretic.  Eyes:     General: No scleral icterus.       Right eye: No discharge.        Left eye: No discharge.     Extraocular Movements: Extraocular movements intact.     Conjunctiva/sclera: Conjunctivae normal.  Cardiovascular:     Rate and Rhythm: Normal rate and regular rhythm.     Pulses: Normal pulses.     Heart sounds: Normal heart sounds. No murmur heard.    No friction rub. No gallop.  Pulmonary:     Effort: Pulmonary effort is normal. No respiratory distress.     Breath sounds: No stridor. No wheezing, rhonchi or rales.  Chest:     Chest wall: No tenderness.  Abdominal:     General: There is no distension.     Palpations: Abdomen is soft.     Tenderness: There is no abdominal tenderness.  Musculoskeletal:        General: No swelling, tenderness, deformity or signs of injury.     Right lower leg: No edema.     Left lower leg: No edema.  Skin:    General: Skin is warm and dry.     Capillary Refill: Capillary refill takes less than 2 seconds.  Neurological:     Mental Status: She is alert and oriented to person, place, and time.     Cranial Nerves: No cranial nerve deficit.     Motor:  No weakness.     Coordination: Coordination normal.     Gait: Gait normal.  Psychiatric:        Mood and Affect: Mood normal.        Behavior: Behavior normal.        Thought Content: Thought content normal.        Judgment: Judgment normal.     Assessment & Plan:   Problem List Items Addressed This Visit  Cardiovascular and Mediastinum   HTN (hypertension) - Primary (Chronic)    BP Readings from Last 3 Encounters:  02/05/22 129/68  02/10/21 (!) 155/102  05/31/20 (!) 199/138   Currently on clonidine 0.1 mg by mouth 3 times daily as needed if BP is greater than 140/90, hydralazine 100 mg 3 times daily, hydrochlorothiazide 25 mg daily, atenolol 100 mg daily Blood pressure well-controlled Continue current medications DASH diet advised patient encouraged to engage in regular moderate exercises at least 150 minutes weekly Appreciate collaboration with nephrology CMP today      Relevant Medications   hydrALAZINE (APRESOLINE) 100 MG tablet   Other Relevant Orders   CMP14+EGFR   CBC with Differential     Other   Class 2 severe obesity due to excess calories with serious comorbidity and body mass index (BMI) of 35.0 to 35.9 in adult [facility])    Stays active by walking 16 hours daily also eats plenty of vegetables, planning on getting the procedure done to remove her belly fat.  Increasing  intake of whole food consisting mainly vegetables and protein less carbohydrate drinking at least 64 ounces of water daily engaging in regular moderate exercises at least 150 minutes weekly, importance of portion control also discussed  Wt Readings from Last 3 Encounters:  02/05/22 185 lb 9.6 oz (84.2 kg)  02/05/20 189 lb 9.6 oz (86 kg)  03/14/19 195 lb 6.4 oz (88.6 kg)   .       Relevant Orders   Hemoglobin A1c   Hyperlipidemia    Currently on atorvastatin 20 mg daily Check lipid panel Avoid fried fried foods      Relevant Medications   hydrALAZINE (APRESOLINE) 100 MG tablet    Other Relevant Orders   Lipid Panel   Tobacco abuse counseling    Smokes about less than 0.5 pack/day  Asked about quitting: confirms that he/she currently smokes cigarettes Advise to quit smoking: Educated about QUITTING to reduce the risk of cancer, cardio and cerebrovascular disease. Assess willingness: Unwilling to quit at this time, but is working on cutting back. Assist with counseling and pharmacotherapy: Counseled for 5 minutes and literature provided. Arrange for follow up: follow up in 3-4 months and continue to offer help.       Environmental and seasonal allergies    Currently taking OTC Allegra Cetirizine works better for her Cetirizine 10 mg daily reordered today      Relevant Medications   cetirizine (ZYRTEC) 10 MG tablet   Other Visit Diagnoses     Encounter for screening mammogram for malignant neoplasm of breast       Relevant Orders   MM Digital Screening   Screening for colon cancer       Relevant Orders   Cologuard   Screening for diabetes mellitus           Outpatient Encounter Medications as of 02/05/2022  Medication Sig   atenolol (TENORMIN) 100 MG tablet Take 1 tablet (100 mg total) by mouth daily.   atorvastatin (LIPITOR) 20 MG tablet TAKE 1 TABLET (20 MG TOTAL) BY MOUTH DAILY.   cloNIDine (CATAPRES) 0.1 MG tablet Take 1 tablet (0.1 mg total) by mouth 3 (three) times daily as needed. Take only if BP > 140/90   etonogestrel (NEXPLANON) 68 MG IMPL implant 1 each by Subdermal route once.   hydrALAZINE (APRESOLINE) 100 MG tablet Take 100 mg by mouth 3 (three) times daily.   hydrochlorothiazide (HYDRODIURIL) 25 MG tablet Take 1 tablet (25 mg total)  by mouth daily.   naproxen (NAPROSYN) 500 MG tablet Take 1 tablet (500 mg total) by mouth 2 (two) times daily with a meal.   [DISCONTINUED] cetirizine (ZYRTEC) 10 MG tablet TAKE 1 TABLET (10 MG TOTAL) BY MOUTH DAILY.   acetaminophen (TYLENOL) 500 MG tablet Take 1 tablet (500 mg total) by mouth every 6 (six)  hours as needed. (Patient not taking: Reported on 02/05/2022)   buPROPion (WELLBUTRIN SR) 150 MG 12 hr tablet TAKE 1 TABLET (150 MG TOTAL) BY MOUTH 2 (TWO) TIMES DAILY. (Patient not taking: Reported on 02/05/2022)   cetirizine (ZYRTEC) 10 MG tablet TAKE 1 TABLET (10 MG TOTAL) BY MOUTH DAILY.   nitroGLYCERIN (NITROSTAT) 0.4 MG SL tablet Place 1 tablet (0.4 mg total) under the tongue every 5 (five) minutes as needed for chest pain. (Patient not taking: Reported on 02/05/2020)   [DISCONTINUED] atenolol (TENORMIN) 50 MG tablet TAKE 1 TABLET (50 MG TOTAL) BY MOUTH DAILY.   [DISCONTINUED] lactulose (CHRONULAC) 10 GM/15ML solution Take 15 mLs (10 g total) by mouth 3 (three) times daily. (Patient not taking: Reported on 02/05/2022)   [DISCONTINUED] lisinopril (ZESTRIL) 40 MG tablet TAKE 1 TABLET (40 MG TOTAL) BY MOUTH DAILY. (Patient not taking: Reported on 02/05/2022)   [DISCONTINUED] traMADol (ULTRAM) 50 MG tablet Take 50-100 mg by mouth 4 (four) times daily as needed. (Patient not taking: Reported on 02/05/2022)   No facility-administered encounter medications on file as of 02/05/2022.    Follow-up: Return in about 4 months (around 06/07/2022) for HTN/HLD/ PAP.   Renee Rival, FNP

## 2022-02-05 NOTE — Assessment & Plan Note (Signed)
Currently on atorvastatin 20 mg daily Check lipid panel Avoid fried fried foods

## 2022-02-05 NOTE — Assessment & Plan Note (Signed)
Smokes about less than 0.5 pack/day  Asked about quitting: confirms that he/she currently smokes cigarettes Advise to quit smoking: Educated about QUITTING to reduce the risk of cancer, cardio and cerebrovascular disease. Assess willingness: Unwilling to quit at this time, but is working on cutting back. Assist with counseling and pharmacotherapy: Counseled for 5 minutes and literature provided. Arrange for follow up: follow up in 3-4 months and continue to offer help.

## 2022-02-05 NOTE — Assessment & Plan Note (Deleted)
Smokes about less than 0.5 pack/day  Asked about quitting: confirms that he/she currently smokes cigarettes Advise to quit smoking: Educated about QUITTING to reduce the risk of cancer, cardio and cerebrovascular disease. Assess willingness: Unwilling to quit at this time, but is working on cutting back. Assist with counseling and pharmacotherapy: Counseled for 5 minutes and literature provided. Arrange for follow up: follow up in 3-4 months and continue to offer help.  

## 2022-02-05 NOTE — Patient Instructions (Signed)
Please call [phone] [facility] to schedule your mammogram .   Please consider getting your TDAP vaccine and flu vaccine   It is important that you exercise regularly at least 30 minutes 5 times a week as tolerated  Think about what you will eat, plan ahead. Choose " clean, green, fresh or frozen" over canned, processed or packaged foods which are more sugary, salty and fatty. 70 to 75% of food eaten should be vegetables and fruit. Three meals at set times with snacks allowed between meals, but they must be fruit or vegetables. Aim to eat over a 12 hour period , example 7 am to 7 pm, and STOP after  your last meal of the day. Drink water,generally about 64 ounces per day, no other drink is as healthy. Fruit juice is best enjoyed in a healthy way, by EATING the fruit.  Thanks for choosing [facility] we consider it a privelige to serve you.

## 2022-02-05 NOTE — Assessment & Plan Note (Addendum)
Stays active by walking 16 hours daily also eats plenty of vegetables, planning on getting the procedure done to remove her belly fat.  Increasing  intake of whole food consisting mainly vegetables and protein less carbohydrate drinking at least 64 ounces of water daily engaging in regular moderate exercises at least 150 minutes weekly, importance of portion control also discussed  Wt Readings from Last 3 Encounters:  02/05/22 185 lb 9.6 oz (84.2 kg)  02/05/20 189 lb 9.6 oz (86 kg)  03/14/19 195 lb 6.4 oz (88.6 kg)   .

## 2022-02-06 LAB — CMP14+EGFR
ALT: 18 IU/L (ref 0–32)
AST: 23 IU/L (ref 0–40)
Albumin/Globulin Ratio: 1.7 (ref 1.2–2.2)
Albumin: 4 g/dL (ref 3.9–4.9)
Alkaline Phosphatase: 77 IU/L (ref 44–121)
BUN/Creatinine Ratio: 12 (ref 9–23)
BUN: 17 mg/dL (ref 6–24)
Bilirubin Total: 0.5 mg/dL (ref 0.0–1.2)
CO2: 23 mmol/L (ref 20–29)
Calcium: 9.5 mg/dL (ref 8.7–10.2)
Chloride: 105 mmol/L (ref 96–106)
Creatinine, Ser: 1.43 mg/dL — ABNORMAL HIGH (ref 0.57–1.00)
Globulin, Total: 2.4 g/dL (ref 1.5–4.5)
Glucose: 105 mg/dL — ABNORMAL HIGH (ref 70–99)
Potassium: 4.1 mmol/L (ref 3.5–5.2)
Sodium: 140 mmol/L (ref 134–144)
Total Protein: 6.4 g/dL (ref 6.0–8.5)
eGFR: 46 mL/min/{1.73_m2} — ABNORMAL LOW (ref >59)

## 2022-02-06 LAB — CBC WITH DIFFERENTIAL/PLATELET
Basophils Absolute: 0.1 10*3/uL (ref 0.0–0.2)
Basos: 1 %
EOS (ABSOLUTE): 0.4 10*3/uL (ref 0.0–0.4)
Eos: 6 %
Hematocrit: 44.3 % (ref 34.0–46.6)
Hemoglobin: 14.2 g/dL (ref 11.1–15.9)
Immature Grans (Abs): 0 10*3/uL (ref 0.0–0.1)
Immature Granulocytes: 0 %
Lymphocytes Absolute: 2.5 10*3/uL (ref 0.7–3.1)
Lymphs: 39 %
MCH: 29.7 pg (ref 26.6–33.0)
MCHC: 32.1 g/dL (ref 31.5–35.7)
MCV: 93 fL (ref 79–97)
Monocytes Absolute: 0.4 10*3/uL (ref 0.1–0.9)
Monocytes: 7 %
Neutrophils Absolute: 3.1 10*3/uL (ref 1.4–7.0)
Neutrophils: 47 %
Platelets: 303 10*3/uL (ref 150–450)
RBC: 4.78 x10E6/uL (ref 3.77–5.28)
RDW: 13.8 % (ref 11.7–15.4)
WBC: 6.5 10*3/uL (ref 3.4–10.8)

## 2022-02-06 LAB — LIPID PANEL
Chol/HDL Ratio: 4.7 ratio — ABNORMAL HIGH (ref 0.0–4.4)
Cholesterol, Total: 189 mg/dL (ref 100–199)
HDL: 40 mg/dL (ref >39)
LDL Chol Calc (NIH): 130 mg/dL — ABNORMAL HIGH (ref 0–99)
Triglycerides: 106 mg/dL (ref 0–149)
VLDL Cholesterol Cal: 19 mg/dL (ref 5–40)

## 2022-02-06 LAB — HEMOGLOBIN A1C
Est. average glucose Bld gHb Est-mCnc: 120 mg/dL
Hgb A1c MFr Bld: 5.8 % — ABNORMAL HIGH (ref 4.8–5.6)

## 2022-02-08 ENCOUNTER — Other Ambulatory Visit: Payer: Self-pay | Admitting: Nurse Practitioner

## 2022-02-08 DIAGNOSIS — E782 Mixed hyperlipidemia: Secondary | ICD-10-CM

## 2022-02-08 MED ORDER — ATORVASTATIN CALCIUM 40 MG PO TABS: 40.0000 mg | ORAL_TABLET | Freq: Every day | ORAL | 0 refills | Status: DC

## 2022-02-08 NOTE — Progress Notes (Signed)
LDL goal is less than 100,to reduce risk of stroke, CVA  start atorvastatin 40mg  daily , avoid fatty fried foods.  Kidney function is slightly decreased, avoid Ibuprofen, aleeve, drink at least 64 ounces of water daily to maintain hydration.    Normal CBC   The 10-year ASCVD risk score (Arnett DK, et al., 2019) is: 8%   Values used to calculate the score:     Age: 45 years     Sex: Female     Is Non-Hispanic African American: Yes     Diabetic: No     Tobacco smoker: Yes     Systolic Blood Pressure: 129 mmHg     Is BP treated: Yes     HDL Cholesterol: 40 mg/dL     Total Cholesterol: 189 mg/dL    Please schedule an appointment in 2 months to recheck her lipid panel, patient should come fasting for that appointment

## 2022-03-15 ENCOUNTER — Encounter: Payer: Self-pay | Admitting: Obstetrics & Gynecology

## 2022-04-07 ENCOUNTER — Ambulatory Visit: Payer: BC Managed Care – PPO

## 2022-06-07 ENCOUNTER — Ambulatory Visit: Payer: Self-pay | Admitting: Nurse Practitioner

## 2022-06-14 ENCOUNTER — Ambulatory Visit: Payer: Medicaid Other | Admitting: Nurse Practitioner

## 2022-07-05 ENCOUNTER — Ambulatory Visit (INDEPENDENT_AMBULATORY_CARE_PROVIDER_SITE_OTHER): Payer: Medicaid Other | Admitting: Nurse Practitioner

## 2022-07-05 ENCOUNTER — Other Ambulatory Visit (HOSPITAL_COMMUNITY)
Admission: RE | Admit: 2022-07-05 | Discharge: 2022-07-05 | Disposition: A | Discharge status: Home / Self Care | Payer: Medicaid Other | Source: Ambulatory Visit | Attending: Nurse Practitioner | Admitting: Nurse Practitioner

## 2022-07-05 ENCOUNTER — Encounter: Payer: Self-pay | Admitting: Nurse Practitioner

## 2022-07-05 VITALS — BP 164/87 | HR 59 | Temp 97.4°F | Ht 61.0 in | Wt 185.0 lb

## 2022-07-05 DIAGNOSIS — Z124 Encounter for screening for malignant neoplasm of cervix: Secondary | ICD-10-CM | POA: Diagnosis present

## 2022-07-05 DIAGNOSIS — E782 Mixed hyperlipidemia: Secondary | ICD-10-CM

## 2022-07-05 DIAGNOSIS — E559 Vitamin D deficiency, unspecified: Secondary | ICD-10-CM

## 2022-07-05 DIAGNOSIS — I10 Essential (primary) hypertension: Secondary | ICD-10-CM

## 2022-07-05 MED ORDER — HYDRALAZINE HCL 100 MG PO TABS: 100.0000 mg | ORAL_TABLET | Freq: Three times a day (TID) | ORAL | 2 refills | Status: AC

## 2022-07-05 MED ORDER — ATORVASTATIN CALCIUM 40 MG PO TABS: 40.0000 mg | ORAL_TABLET | Freq: Every day | ORAL | 0 refills | Status: DC

## 2022-07-05 MED ORDER — ATENOLOL 100 MG PO TABS: 100.0000 mg | ORAL_TABLET | Freq: Every day | ORAL | 1 refills | Status: DC

## 2022-07-05 MED ORDER — HYDROCHLOROTHIAZIDE 25 MG PO TABS: 25.0000 mg | ORAL_TABLET | Freq: Every day | ORAL | 1 refills | Status: DC

## 2022-07-05 MED ORDER — BLOOD PRESSURE MONITOR AUTOMAT DEVI: 0 refills | Status: AC

## 2022-07-05 NOTE — Assessment & Plan Note (Signed)
Lab Results  Component Value Date   CHOL 189 02/05/2022   HDL 40 02/05/2022   LDLCALC 130 (H) 02/05/2022   TRIG 106 02/05/2022   CHOLHDL 4.7 (H) 02/05/2022   The 10-year ASCVD risk score (Arnett DK, et al., 2019) is: 12.7%   Values used to calculate the score:     Age: 47 years     Sex: Female     Is Non-Hispanic African American: Yes     Diabetic: No     Tobacco smoker: No     Systolic Blood Pressure: 164 mmHg     Is BP treated: Yes     HDL Cholesterol: 40 mg/dL     Total Cholesterol: 189 mg/dL  Currently on atorvastatin 40 mg daily Will check fasting lipid panel at next visit LDL goal is less than 100

## 2022-07-05 NOTE — Patient Instructions (Addendum)
Please get your mammogram and Cologuard test done as dicussed.  Around 3 times per week, check your blood pressure 2 times per day. once in the morning and once in the evening. The readings should be at least one minute apart. Write down these values and bring them to your next nurse visit/appointment.  When you check your BP, make sure you have been doing something calm/relaxing 5 minutes prior to checking. Both feet should be flat on the floor and you should be sitting. Use your left arm and make sure it is in a relaxed position (on a table), and that the cuff is at the approximate level/height of your heart.   Blood pressure goal is less than 140/90.  1. Hypertension, unspecified type  - Basic Metabolic Panel  2. Screening for cervical cancer   3. Essential hypertension  - atenolol (TENORMIN) 100 MG tablet; Take 1 tablet (100 mg total) by mouth daily.  Dispense: 90 tablet; Refill: 1 - hydrALAZINE (APRESOLINE) 100 MG tablet; Take 1 tablet (100 mg total) by mouth 3 (three) times daily.  Dispense: 180 tablet; Refill: 2 - hydrochlorothiazide (HYDRODIURIL) 25 MG tablet; Take 1 tablet (25 mg total) by mouth daily.  Dispense: 90 tablet; Refill: 1     .

## 2022-07-05 NOTE — Assessment & Plan Note (Signed)
BP Readings from Last 3 Encounters:  07/05/22 (!) 164/87  02/05/22 129/68  02/10/21 (!) 155/102  Chronic uncontrolled condition, denies shortness of breath, edema, dizziness Currently on atenolol 100 mg daily, hydralazine 100 mg 3 times daily, hydrochlorothiazide 25 mg daily.  Patient reports that she has stopped taking hydrochlorothiazide also takes hydralazine 100 mg twice daily instead of 3 times daily.  It is that she had a severe headache few weeks ago thought that she might have been taking too much blood pressure medication.  She does not check her blood pressure at home.  Reports that she follows a low-salt diet and does walking exercises daily.  Need to take all medications daily as ordered discussed Continue current medications. No changes in management.  Medications were refilled Discussed DASH diet and dietary sodium restrictions Continue to increase dietary efforts and exercise.  Monitor blood pressure at home keep a log and bring to next appointment in 4 weeks. BMP today

## 2022-07-05 NOTE — Progress Notes (Signed)
Established Patient Office Visit  Subjective:  Patient ID: Jennifer Ayers, female    DOB: 1975/06/12  Age: 47 y.o. MRN: [id number]  CC:  Chief Complaint  Patient presents with   Follow-up    For HTN/ pap.     HPI Jennifer Ayers is a 47 y.o. female  has a past medical history of Hyperlipidemia, Hypertension, and Obesity (BMI 30.0-34.9).  Patient presents for follow-up for her chronic medical conditions .  She denies any adverse reactions to current medications Mammogram and Cologuard ordered at her previous visit patient encouraged to get a test done.  She has stopped smoking cigarettes 2 months ago, patient congratulated on her efforts     Past Medical History:  Diagnosis Date   Hyperlipidemia    Hypertension    Obesity (BMI 30.0-34.9)     Past Surgical History:  Procedure Laterality Date   CESAREAN SECTION     LEFT HEART CATH AND CORONARY ANGIOGRAPHY N/A 04/28/2017   Procedure: LEFT HEART CATH AND CORONARY ANGIOGRAPHY;  Surgeon: Corky Crafts, MD;  Location: [facility] INVASIVE CV LAB;  Service: Cardiovascular;  Laterality: N/A;   TONSILLECTOMY      Family History  Problem Relation Age of Onset   Hypertension Mother    Hypertension Father    Colon cancer Neg Hx    Breast cancer Neg Hx     Social History   Socioeconomic History   Marital status: Single    Spouse name: Not on file   Number of children: 1   Years of education: Not on file   Highest education level: Not on file  Occupational History   Not on file  Tobacco Use   Smoking status: Former    Years: 5    Types: Cigarettes   Smokeless tobacco: Never  Vaping Use   Vaping Use: Never used  Substance and Sexual Activity   Alcohol use: No   Drug use: No   Sexual activity: Yes    Birth control/protection: None  Other Topics Concern   Not on file  Social History Narrative   Lives with her boyfriend.    Social Determinants of Health   Financial Resource Strain: Low Risk  (02/20/2018)    Overall Financial Resource Strain (CARDIA)    Difficulty of Paying Living Expenses: Not hard at all  Food Insecurity: No Food Insecurity (02/20/2018)   Hunger Vital Sign    Worried About Running Out of Food in the Last Year: Never true    Ran Out of Food in the Last Year: Never true  Transportation Needs: No Transportation Needs (02/20/2018)   PRAPARE - Administrator, Civil Service (Medical): No    Lack of Transportation (Non-Medical): No  Physical Activity: Insufficiently Active (02/20/2018)   Exercise Vital Sign    Days of Exercise per Week: 1 day    Minutes of Exercise per Session: 20 min  Stress: No Stress Concern Present (02/20/2018)   [employer] of Occupational Health - Occupational Stress Questionnaire    Feeling of Stress : Not at all  Social Connections: Not on file  Intimate Partner Violence: Not At Risk (02/20/2018)   Humiliation, Afraid, Rape, and Kick questionnaire    Fear of Current or Ex-Partner: No    Emotionally Abused: No    Physically Abused: No    Sexually Abused: No    Outpatient Medications Prior to Visit  Medication Sig Dispense Refill   cetirizine (ZYRTEC) 10 MG tablet TAKE 1 TABLET (10 MG TOTAL)  BY MOUTH DAILY. 90 tablet 3   etonogestrel (NEXPLANON) 68 MG IMPL implant 1 each by Subdermal route once.     naproxen (NAPROSYN) 500 MG tablet Take 1 tablet (500 mg total) by mouth 2 (two) times daily with a meal. 180 tablet 3   atenolol (TENORMIN) 100 MG tablet Take 1 tablet (100 mg total) by mouth daily. 30 tablet 1   atorvastatin (LIPITOR) 40 MG tablet Take 1 tablet (40 mg total) by mouth daily. 90 tablet 0   hydrALAZINE (APRESOLINE) 100 MG tablet Take 100 mg by mouth 3 (three) times daily.     hydrochlorothiazide (HYDRODIURIL) 25 MG tablet Take 1 tablet (25 mg total) by mouth daily. 90 tablet 3   acetaminophen (TYLENOL) 500 MG tablet Take 1 tablet (500 mg total) by mouth every 6 (six) hours as needed. (Patient not taking: Reported on  02/05/2022) 30 tablet 0   buPROPion (WELLBUTRIN SR) 150 MG 12 hr tablet TAKE 1 TABLET (150 MG TOTAL) BY MOUTH 2 (TWO) TIMES DAILY. (Patient not taking: Reported on 02/05/2022) 180 tablet 3   cloNIDine (CATAPRES) 0.1 MG tablet Take 1 tablet (0.1 mg total) by mouth 3 (three) times daily as needed. Take only if BP > 140/90 (Patient not taking: Reported on 07/05/2022) 30 tablet 6   nitroGLYCERIN (NITROSTAT) 0.4 MG SL tablet Place 1 tablet (0.4 mg total) under the tongue every 5 (five) minutes as needed for chest pain. (Patient not taking: Reported on 07/05/2022) 100 tablet 11   No facility-administered medications prior to visit.    Allergies  Allergen Reactions   Amlodipine Other (See Comments)    Really severe headaches     ROS Review of Systems  Constitutional:  Negative for activity change, appetite change, chills, fatigue and fever.  HENT:  Negative for congestion, dental problem, ear discharge, ear pain, hearing loss, rhinorrhea, sinus pressure, sinus pain, sneezing and sore throat.   Eyes:  Negative for pain, discharge, redness and itching.  Respiratory:  Negative for cough, chest tightness, shortness of breath and wheezing.   Cardiovascular:  Negative for chest pain, palpitations and leg swelling.  Gastrointestinal:  Negative for abdominal distention, abdominal pain, anal bleeding, blood in stool, constipation, diarrhea, nausea, rectal pain and vomiting.  Endocrine: Negative for cold intolerance, heat intolerance, polydipsia, polyphagia and polyuria.  Genitourinary:  Negative for difficulty urinating, dysuria, flank pain, frequency, hematuria, menstrual problem, pelvic pain and vaginal bleeding.  Musculoskeletal:  Negative for arthralgias, back pain, gait problem, joint swelling and myalgias.  Skin:  Negative for color change, pallor, rash and wound.  Allergic/Immunologic: Negative for environmental allergies, food allergies and immunocompromised state.  Neurological:  Negative for  dizziness, tremors, facial asymmetry, weakness and headaches.  Hematological:  Negative for adenopathy. Does not bruise/bleed easily.  Psychiatric/Behavioral:  Negative for agitation, behavioral problems, confusion, decreased concentration, hallucinations, self-injury and suicidal ideas.       Objective:    Physical Exam Vitals and nursing note reviewed. Exam conducted with a chaperone present.  Constitutional:      General: She is not in acute distress.    Appearance: Normal appearance. She is obese. She is not ill-appearing, toxic-appearing or diaphoretic.  HENT:     Mouth/Throat:     Mouth: Mucous membranes are moist.     Pharynx: Oropharynx is clear. No oropharyngeal exudate or posterior oropharyngeal erythema.  Eyes:     General: No scleral icterus.       Right eye: No discharge.  Left eye: No discharge.     Extraocular Movements: Extraocular movements intact.     Conjunctiva/sclera: Conjunctivae normal.  Cardiovascular:     Rate and Rhythm: Normal rate and regular rhythm.     Pulses: Normal pulses.     Heart sounds: Normal heart sounds. No murmur heard.    No friction rub. No gallop.  Pulmonary:     Effort: Pulmonary effort is normal. No respiratory distress.     Breath sounds: Normal breath sounds. No stridor. No wheezing, rhonchi or rales.  Chest:     Chest wall: No mass, lacerations, deformity, swelling, tenderness or edema.  Breasts:    Tanner Score is 5.     Breasts are symmetrical.     Right: Normal. No swelling, bleeding, inverted nipple, mass, nipple discharge, skin change or tenderness.     Left: Normal. No swelling, bleeding, inverted nipple, mass, nipple discharge, skin change or tenderness.  Abdominal:     General: There is no distension.     Palpations: Abdomen is soft.     Tenderness: There is no abdominal tenderness. There is no right CVA tenderness, left CVA tenderness or guarding.     Hernia: There is no hernia in the left inguinal area or right  inguinal area.  Genitourinary:    General: Normal vulva.     Exam position: Lithotomy position.     Pubic Area: No rash or pubic lice.      Tanner stage (genital): 5.     Labia:        Right: No rash, tenderness, lesion or injury.        Left: No rash, tenderness, lesion or injury.      Urethra: No prolapse, urethral pain, urethral swelling or urethral lesion.     Vagina: No signs of injury and foreign body. No vaginal discharge, erythema, tenderness, bleeding, lesions or prolapsed vaginal walls.     Cervix: No cervical motion tenderness, discharge, friability, lesion, erythema, cervical bleeding or eversion.     Uterus: Normal. Not enlarged, not fixed, not tender and no uterine prolapse.      Adnexa:        Right: No mass, tenderness or fullness.         Left: No mass, tenderness or fullness.    Musculoskeletal:        General: No swelling, tenderness, deformity or signs of injury.     Right lower leg: No edema.     Left lower leg: No edema.  Lymphadenopathy:     Upper Body:     Right upper body: No supraclavicular, axillary or pectoral adenopathy.     Left upper body: No supraclavicular, axillary or pectoral adenopathy.     Lower Body: No right inguinal adenopathy. No left inguinal adenopathy.  Skin:    General: Skin is warm and dry.     Capillary Refill: Capillary refill takes 2 to 3 seconds.     Coloration: Skin is not jaundiced or pale.     Findings: No bruising, erythema or lesion.  Neurological:     Mental Status: She is alert and oriented to person, place, and time.     Motor: No weakness.     Coordination: Coordination normal.     Gait: Gait normal.  Psychiatric:        Mood and Affect: Mood normal.        Behavior: Behavior normal.        Thought Content: Thought content normal.  Judgment: Judgment normal.     BP (!) 164/87   Pulse (!) 59   Temp (!) 97.4 F (36.3 C)   Ht 5\' 1"  (1.549 m)   Wt 185 lb (83.9 kg)   SpO2 100%   BMI 34.96 kg/m  Wt Readings  from Last 3 Encounters:  07/05/22 185 lb (83.9 kg)  02/05/22 185 lb 9.6 oz (84.2 kg)  02/05/20 189 lb 9.6 oz (86 kg)    Lab Results  Component Value Date   TSH 0.837 02/05/2020   Lab Results  Component Value Date   WBC 6.5 02/05/2022   HGB 14.2 02/05/2022   HCT 44.3 02/05/2022   MCV 93 02/05/2022   PLT 303 02/05/2022   Lab Results  Component Value Date   NA 140 02/05/2022   K 4.1 02/05/2022   CO2 23 02/05/2022   GLUCOSE 105 (H) 02/05/2022   BUN 17 02/05/2022   CREATININE 1.43 (H) 02/05/2022   BILITOT 0.5 02/05/2022   ALKPHOS 77 02/05/2022   AST 23 02/05/2022   ALT 18 02/05/2022   PROT 6.4 02/05/2022   ALBUMIN 4.0 02/05/2022   CALCIUM 9.5 02/05/2022   ANIONGAP 7 02/12/2018   EGFR 46 (L) 02/05/2022   Lab Results  Component Value Date   CHOL 189 02/05/2022   Lab Results  Component Value Date   HDL 40 02/05/2022   Lab Results  Component Value Date   LDLCALC 130 (H) 02/05/2022   Lab Results  Component Value Date   TRIG 106 02/05/2022   Lab Results  Component Value Date   CHOLHDL 4.7 (H) 02/05/2022   Lab Results  Component Value Date   HGBA1C 5.8 (H) 02/05/2022      Assessment & Plan:   Problem List Items Addressed This Visit       Cardiovascular and Mediastinum   Essential hypertension    BP Readings from Last 3 Encounters:  07/05/22 (!) 164/87  02/05/22 129/68  02/10/21 (!) 155/102  Chronic uncontrolled condition, denies shortness of breath, edema, dizziness Currently on atenolol 100 mg daily, hydralazine 100 mg 3 times daily, hydrochlorothiazide 25 mg daily.  Patient reports that she has stopped taking hydrochlorothiazide also takes hydralazine 100 mg twice daily instead of 3 times daily.  It is that she had a severe headache few weeks ago thought that she might have been taking too much blood pressure medication.  She does not check her blood pressure at home.  Reports that she follows a low-salt diet and does walking exercises daily.  Need to  take all medications daily as ordered discussed Continue current medications. No changes in management.  Medications were refilled Discussed DASH diet and dietary sodium restrictions Continue to increase dietary efforts and exercise.  Monitor blood pressure at home keep a log and bring to next appointment in 4 weeks. BMP today         Relevant Medications   atenolol (TENORMIN) 100 MG tablet   hydrALAZINE (APRESOLINE) 100 MG tablet   hydrochlorothiazide (HYDRODIURIL) 25 MG tablet   atorvastatin (LIPITOR) 40 MG tablet   Other Relevant Orders   Basic Metabolic Panel     Other   Hyperlipidemia    Lab Results  Component Value Date   CHOL 189 02/05/2022   HDL 40 02/05/2022   LDLCALC 130 (H) 02/05/2022   TRIG 106 02/05/2022   CHOLHDL 4.7 (H) 02/05/2022   The 10-year ASCVD risk score (Arnett DK, et al., 2019) is: 12.7%   Values used to calculate  the score:     Age: 31 years     Sex: Female     Is Non-Hispanic African American: Yes     Diabetic: No     Tobacco smoker: No     Systolic Blood Pressure: 164 mmHg     Is BP treated: Yes     HDL Cholesterol: 40 mg/dL     Total Cholesterol: 189 mg/dL  Currently on atorvastatin 40 mg daily Will check fasting lipid panel at next visit LDL goal is less than 100      Relevant Medications   atenolol (TENORMIN) 100 MG tablet   hydrALAZINE (APRESOLINE) 100 MG tablet   hydrochlorothiazide (HYDRODIURIL) 25 MG tablet   atorvastatin (LIPITOR) 40 MG tablet   Screening for cervical cancer - Primary   Relevant Orders   Cytology - PAP()   Vitamin D deficiency    Last vitamin D Lab Results  Component Value Date   VD25OH 27.5 (L) 02/05/2020  Checking vitamin D levels      Relevant Orders   VITAMIN D 25 Hydroxy (Vit-D Deficiency, Fractures)    Meds ordered this encounter  Medications   atenolol (TENORMIN) 100 MG tablet    Sig: Take 1 tablet (100 mg total) by mouth daily.    Dispense:  90 tablet    Refill:  1   hydrALAZINE  (APRESOLINE) 100 MG tablet    Sig: Take 1 tablet (100 mg total) by mouth 3 (three) times daily.    Dispense:  180 tablet    Refill:  2   hydrochlorothiazide (HYDRODIURIL) 25 MG tablet    Sig: Take 1 tablet (25 mg total) by mouth daily.    Dispense:  90 tablet    Refill:  1   atorvastatin (LIPITOR) 40 MG tablet    Sig: Take 1 tablet (40 mg total) by mouth daily.    Dispense:  90 tablet    Refill:  0   Blood Pressure Monitoring (BLOOD PRESSURE MONITOR AUTOMAT) DEVI    Sig: Please check your blood pressure.    Dispense:  1 each    Refill:  0    Follow-up: Return in about 4 weeks (around 08/02/2022) for HTN.    Donell Beers, FNP

## 2022-07-05 NOTE — Assessment & Plan Note (Signed)
Last vitamin D Lab Results  Component Value Date   VD25OH 27.5 (L) 02/05/2020  Checking vitamin D levels

## 2022-07-06 ENCOUNTER — Other Ambulatory Visit: Payer: Self-pay | Admitting: Nurse Practitioner

## 2022-07-06 DIAGNOSIS — E559 Vitamin D deficiency, unspecified: Secondary | ICD-10-CM

## 2022-07-06 DIAGNOSIS — N1832 Chronic kidney disease, stage 3b: Secondary | ICD-10-CM

## 2022-07-06 LAB — BASIC METABOLIC PANEL
BUN/Creatinine Ratio: 9 (ref 9–23)
BUN: 13 mg/dL (ref 6–24)
CO2: 21 mmol/L (ref 20–29)
Calcium: 9.3 mg/dL (ref 8.7–10.2)
Chloride: 106 mmol/L (ref 96–106)
Creatinine, Ser: 1.48 mg/dL — ABNORMAL HIGH (ref 0.57–1.00)
Glucose: 85 mg/dL (ref 70–99)
Potassium: 4.4 mmol/L (ref 3.5–5.2)
Sodium: 144 mmol/L (ref 134–144)
eGFR: 44 mL/min/{1.73_m2} — ABNORMAL LOW (ref >59)

## 2022-07-06 LAB — VITAMIN D 25 HYDROXY (VIT D DEFICIENCY, FRACTURES): Vit D, 25-Hydroxy: 26.6 ng/mL — ABNORMAL LOW (ref 30.0–100.0)

## 2022-07-08 LAB — CYTOLOGY - PAP
Adequacy: ABSENT
Comment: NEGATIVE
Diagnosis: NEGATIVE
High risk HPV: NEGATIVE

## 2022-07-20 ENCOUNTER — Other Ambulatory Visit: Payer: Self-pay | Admitting: Nurse Practitioner

## 2022-07-20 DIAGNOSIS — E782 Mixed hyperlipidemia: Secondary | ICD-10-CM

## 2022-07-29 ENCOUNTER — Telehealth: Payer: Self-pay

## 2022-07-29 NOTE — Progress Notes (Signed)
Patient attempted to be outreached by Emireth Cockerham, PharmD Candidate on 07/29/22 to discuss hypertension. Left voicemail for patient to return our call at their convenience at [phone].  Aeryn Medici, Student-PharmD  

## 2022-08-03 ENCOUNTER — Ambulatory Visit: Payer: Medicaid Other | Admitting: Nurse Practitioner

## 2022-10-24 ENCOUNTER — Other Ambulatory Visit: Payer: Self-pay | Admitting: Nurse Practitioner

## 2022-10-24 DIAGNOSIS — E782 Mixed hyperlipidemia: Secondary | ICD-10-CM

## 2022-10-25 ENCOUNTER — Other Ambulatory Visit: Payer: Self-pay | Admitting: Nurse Practitioner

## 2022-10-25 DIAGNOSIS — E782 Mixed hyperlipidemia: Secondary | ICD-10-CM

## 2022-10-25 MED ORDER — ATORVASTATIN CALCIUM 40 MG PO TABS: 40.0000 mg | ORAL_TABLET | Freq: Every day | ORAL | 0 refills | Status: DC

## 2022-11-25 ENCOUNTER — Telehealth: Payer: Self-pay

## 2022-11-25 NOTE — Patient Outreach (Signed)
Care Guide Note  11/25/2022 Name: Jennifer Ayers MRN: [id number] DOB: 31-Jan-1976  Referred by: Donell Beers, FNP Reason for referral : patient outreach (Outreach to schedule with RPH.)   Jennifer Ayers is a 47 y.o. year old female who is a primary care patient of Donell Beers, FNP. Jennifer Ayers was referred to the pharmacist for assistance related to HTN.    An unsuccessful telephone outreach was attempted today to contact the patient who was referred to the pharmacy team for assistance with HTN. Additional attempts will be made to contact the patient.   Jennifer Ayers Assistant-Population Health [phone]

## 2023-01-20 ENCOUNTER — Other Ambulatory Visit: Payer: Self-pay | Admitting: Nurse Practitioner

## 2023-01-20 DIAGNOSIS — I10 Essential (primary) hypertension: Secondary | ICD-10-CM

## 2023-01-23 ENCOUNTER — Other Ambulatory Visit: Payer: Self-pay | Admitting: Nurse Practitioner

## 2023-01-23 DIAGNOSIS — I10 Essential (primary) hypertension: Secondary | ICD-10-CM

## 2023-07-02 ENCOUNTER — Other Ambulatory Visit: Payer: Self-pay | Admitting: Nurse Practitioner

## 2023-07-02 DIAGNOSIS — E782 Mixed hyperlipidemia: Secondary | ICD-10-CM

## 2023-07-02 DIAGNOSIS — I10 Essential (primary) hypertension: Secondary | ICD-10-CM

## 2023-09-22 ENCOUNTER — Other Ambulatory Visit: Payer: Self-pay | Admitting: Nurse Practitioner

## 2023-09-22 DIAGNOSIS — I10 Essential (primary) hypertension: Secondary | ICD-10-CM

## 2023-12-02 ENCOUNTER — Other Ambulatory Visit: Payer: Self-pay | Admitting: Nurse Practitioner

## 2023-12-02 DIAGNOSIS — I10 Essential (primary) hypertension: Secondary | ICD-10-CM

## 2024-02-04 ENCOUNTER — Other Ambulatory Visit: Payer: Self-pay | Admitting: Nurse Practitioner

## 2024-02-04 DIAGNOSIS — I10 Essential (primary) hypertension: Secondary | ICD-10-CM

## 2024-02-05 ENCOUNTER — Other Ambulatory Visit: Payer: Self-pay | Admitting: Nurse Practitioner

## 2024-02-05 DIAGNOSIS — E782 Mixed hyperlipidemia: Secondary | ICD-10-CM

## 2024-02-17 ENCOUNTER — Other Ambulatory Visit: Payer: Self-pay | Admitting: Nurse Practitioner

## 2024-02-17 DIAGNOSIS — I10 Essential (primary) hypertension: Secondary | ICD-10-CM

## 2024-03-10 ENCOUNTER — Other Ambulatory Visit: Payer: Self-pay | Admitting: Nurse Practitioner

## 2024-03-10 DIAGNOSIS — E782 Mixed hyperlipidemia: Secondary | ICD-10-CM

## 2024-03-12 NOTE — Telephone Encounter (Signed)
 atorvastatin  (LIPITOR) 40 MG tablet [Pharmacy Med Name: ATORVASTATIN  40 MG TABLET]
